# Patient Record
Sex: Female | Born: 1980 | Race: Black or African American | Hispanic: No | Smoking: Never smoker
Health system: Southern US, Community
[De-identification: ages and names within clinical notes are randomized; demographics above are authoritative.]

## PROBLEM LIST (undated history)

## (undated) DIAGNOSIS — T7840XA Allergy, unspecified, initial encounter: Secondary | ICD-10-CM

## (undated) DIAGNOSIS — D649 Anemia, unspecified: Secondary | ICD-10-CM

## (undated) DIAGNOSIS — E119 Type 2 diabetes mellitus without complications: Secondary | ICD-10-CM

## (undated) HISTORY — DX: Allergy, unspecified, initial encounter: T78.40XA

---
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
## 1998-10-21 ENCOUNTER — Emergency Department (HOSPITAL_COMMUNITY): Admission: EM | Admit: 1998-10-21 | Discharge: 1998-10-21 | Payer: Self-pay | Admitting: Emergency Medicine

## 1999-01-31 ENCOUNTER — Other Ambulatory Visit: Admission: RE | Admit: 1999-01-31 | Discharge: 1999-01-31 | Payer: Self-pay | Admitting: Obstetrics and Gynecology

## 2000-05-27 ENCOUNTER — Emergency Department (HOSPITAL_COMMUNITY): Admission: EM | Admit: 2000-05-27 | Discharge: 2000-05-27 | Payer: Self-pay | Admitting: Emergency Medicine

## 2001-05-19 ENCOUNTER — Other Ambulatory Visit: Admission: RE | Admit: 2001-05-19 | Discharge: 2001-05-19 | Payer: Self-pay | Admitting: Obstetrics and Gynecology

## 2002-02-21 ENCOUNTER — Emergency Department (HOSPITAL_COMMUNITY): Admission: EM | Admit: 2002-02-21 | Discharge: 2002-02-21 | Payer: Self-pay | Admitting: Emergency Medicine

## 2002-10-16 ENCOUNTER — Other Ambulatory Visit: Admission: RE | Admit: 2002-10-16 | Discharge: 2002-10-16 | Payer: Self-pay | Admitting: Obstetrics and Gynecology

## 2002-10-19 ENCOUNTER — Other Ambulatory Visit: Admission: RE | Admit: 2002-10-19 | Discharge: 2002-10-19 | Payer: Self-pay | Admitting: Obstetrics and Gynecology

## 2002-10-22 ENCOUNTER — Ambulatory Visit (HOSPITAL_COMMUNITY): Admission: RE | Admit: 2002-10-22 | Discharge: 2002-10-22 | Payer: Self-pay | Admitting: Obstetrics and Gynecology

## 2002-10-22 ENCOUNTER — Encounter (INDEPENDENT_AMBULATORY_CARE_PROVIDER_SITE_OTHER): Payer: Self-pay

## 2003-04-20 ENCOUNTER — Other Ambulatory Visit: Admission: RE | Admit: 2003-04-20 | Discharge: 2003-04-20 | Payer: Self-pay | Admitting: Obstetrics and Gynecology

## 2003-10-05 ENCOUNTER — Inpatient Hospital Stay (HOSPITAL_COMMUNITY): Admission: AD | Admit: 2003-10-05 | Discharge: 2003-10-05 | Payer: Self-pay | Admitting: Obstetrics and Gynecology

## 2003-11-04 ENCOUNTER — Inpatient Hospital Stay (HOSPITAL_COMMUNITY): Admission: AD | Admit: 2003-11-04 | Discharge: 2003-11-04 | Payer: Self-pay | Admitting: Obstetrics and Gynecology

## 2003-11-08 ENCOUNTER — Inpatient Hospital Stay (HOSPITAL_COMMUNITY): Admission: AD | Admit: 2003-11-08 | Discharge: 2003-11-12 | Payer: Self-pay | Admitting: Obstetrics and Gynecology

## 2003-12-15 ENCOUNTER — Other Ambulatory Visit: Admission: RE | Admit: 2003-12-15 | Discharge: 2003-12-15 | Payer: Self-pay | Admitting: Obstetrics and Gynecology

## 2004-04-04 ENCOUNTER — Other Ambulatory Visit: Admission: RE | Admit: 2004-04-04 | Discharge: 2004-04-04 | Payer: Self-pay | Admitting: Obstetrics and Gynecology

## 2004-08-10 ENCOUNTER — Emergency Department (HOSPITAL_COMMUNITY): Admission: EM | Admit: 2004-08-10 | Discharge: 2004-08-10 | Payer: Self-pay | Admitting: Emergency Medicine

## 2006-04-22 ENCOUNTER — Emergency Department (HOSPITAL_COMMUNITY): Admission: EM | Admit: 2006-04-22 | Discharge: 2006-04-23 | Payer: Self-pay | Admitting: Emergency Medicine

## 2006-12-12 ENCOUNTER — Emergency Department (HOSPITAL_COMMUNITY): Admission: EM | Admit: 2006-12-12 | Discharge: 2006-12-12 | Payer: Self-pay | Admitting: Emergency Medicine

## 2007-02-11 ENCOUNTER — Emergency Department (HOSPITAL_COMMUNITY): Admission: EM | Admit: 2007-02-11 | Discharge: 2007-02-11 | Payer: Self-pay | Admitting: Emergency Medicine

## 2007-06-22 ENCOUNTER — Emergency Department (HOSPITAL_COMMUNITY): Admission: EM | Admit: 2007-06-22 | Discharge: 2007-06-22 | Payer: Self-pay | Admitting: Emergency Medicine

## 2007-08-21 ENCOUNTER — Emergency Department (HOSPITAL_COMMUNITY): Admission: EM | Admit: 2007-08-21 | Discharge: 2007-08-21 | Payer: Self-pay | Admitting: Emergency Medicine

## 2010-10-20 NOTE — Op Note (Signed)
Patricia Burton, Patricia Burton                         ACCOUNT NO.:  [id number]   MEDICAL RECORD NO.:  [id number]                   PATIENT TYPE:  INP   LOCATION:  9143                                 FACILITY:  [facility]   PHYSICIAN:  Freddy Finner, M.D.                DATE OF BIRTH:  [birth year]   DATE OF PROCEDURE:  11/09/2003  DATE OF DISCHARGE:                                 OPERATIVE REPORT   PREOPERATIVE DIAGNOSIS:  1. Intrauterine pregnancy at term.  2. Nonreassuring fetal heart tracing.  3. Protracted active phase of labor with molding of the fetal vertex at a -1     to 0 station.   POSTOPERATIVE DIAGNOSIS:  1. Intrauterine pregnancy at term.  2. Nonreassuring fetal heart tracing.  3. Protracted active phase of labor with molding of the fetal vertex at a -1     to 0 station.   OPERATIVE PROCEDURE:  Primary low transverse cervical cesarean section with  delivery of viable female infant, Apgars of 8 and 9, cord pH of 7.20.   Patient is a 30 year old admitted in early labor.  Early in her labor course  she had placement of an epidural and augmentation of her labor with IV  Pitocin when failure of progression occurred with simple rupture of  membranes.  From the outset the fetal vertex was very high although it was  well applied to the cervix, it was in a -3 station.  Later in the course of  her labor an intrauterine pressure transducer was applied and the patient  was then allowed to labor with adequate __________ units for a period of  approximately 6 hours.  She progressed to complete and pushing but was  having moderate variable decelerations with each contraction and rebound  fetal tachycardia.  Examination of the patient did reveal that the vertex  was molding, that the true biparietal diameter had not descended into the  pelvis.  It was elected to proceed with cesarean delivery.  She was brought  to the operating room where the existing epidural was dosed for surgery.  She had a  Foley catheter in place.  The abdomen was prepped and draped in  the usual fashion.  A lower abdominal transverse incision was made sharply  and extended sharply to the fascia.  Fascia was entered sharply and extended  transversely to the extent of skin incision.  Rectus sheath was developed  superiorly and inferiorly with blunt and sharp dissection.  Rectus muscles  were divided in the midline.  Peritoneum was elevated, entered sharply and  extended bluntly to the skin incision.  The lower segment was fully thinned  and a transverse incision was made to the lower segment without dissecting  the bladder free.  This was performed above the reflection of the bladder.  Amniotic fluid was clear.  Incision in the uterus was extended bluntly in a  transverse direction.  A  viable female infant was then delivered without  difficulty.  Apgars and cord pH are noted above.  Cord blood was obtained  for routine venous sample and for arterial blood gases.  Placenta and other  products of conception were removed from the uterus.  The uterus was  delivered onto the anterior abdominal wall.  Angles of the uterine incision  were grasped with ring forceps.  Uterus, tubes and ovaries were noted to be  normal.  The uterus was initially boggy but responded to IV Pitocin.  The  uterine incision was closed with a running locking 0 Monocryl suture.  Figure-of-eight was required at the left margin for complete hemostasis.  The bladder flap was reapproximated with an interrupted 0 Monocryl suture.  The uterus was delivered back into the abdominal cavity.  Irrigation was  carried out.  Pack, needle and instrument counts were correct.  Hemostasis  was adequate.  The abdominal incision was then closed in layers.  Running 0  Monocryl was used to close the peritoneum and  reapproximate the rectus muscles.  The fascia was closed with running 0 PDS,  skin was closed with a lot of skin staples and 0.25 Steri-Strips.   Bleeding  subcutaneous vessels were controlled with the Bovie.  The patient tolerated  the operative procedure well, was taken to recovery in good condition.                                               Freddy Finner, M.D.    WRN/MEDQ  D:  11/09/2003  T:  11/09/2003  Job:  [id number]

## 2010-10-20 NOTE — Discharge Summary (Signed)
Patricia Burton, Patricia Burton                         ACCOUNT NO.:  [id number]   MEDICAL RECORD NO.:  [id number]                   PATIENT TYPE:  INP   LOCATION:  9143                                 [facility]ILITY[facility]  [facility]   PHYSICIAN:  Freddy Finner, M.D.                DATE OF BIRTH:  1980-11-05   DATE OF ADMISSION:  11/08/2003  DATE OF DISCHARGE:  11/12/2003                                 DISCHARGE SUMMARY   ADMISSION DIAGNOSES:  1. Intrauterine pregnancy at term.  2. Early labor.   DISCHARGE DIAGNOSES:  1. Status post low transverse cesarean section secondary to nonreassuring     fetal heart tones and cephalopelvic disproportion.  2. Viable female infant.   PROCEDURE:  Primary low transverse cesarean section.   REASON FOR ADMISSION:  Please see written H&P.   HOSPITAL COURSE:  The patient was a 30 year old African American female,  gravida 3, para 1 who was admitted to [facility] in early  labor.  Early in her labor course, the patient did have placement of an  epidural for her comfort and augmentation of her labor with IV Pitocin.  Artificial rupture of membranes was performed revealing clear fluid.  Vertex  remained high throughout her labor in -3 station.  Intrauterine pressure  catheter was applied for assessment of adequate labor contractions.  Patient  did progress to complete dilatation and did push with fetal heart tones  revealing moderate variable decelerations with each contraction and rebound  fetal tachycardia.  Vaginal examination did reveal that the vertex was  molding with a true biparietal diameter not descended into the pelvis.  At  that time, decision was made to proceed with low transverse cesarean  section.  The patient was brought to the operating room where epidural was  dosed to an adequate surgical level.  A low transverse incision was made  with delivery of a viable female infant weighing 9 pounds 5 ounces, Apgars  of 8 at one minute and 9 at  five minutes.  Umbilical cord pH was 7.20.  The  patient tolerated the procedure well and was taken to the recovery room in  stable condition.   On postoperative day #1, vital signs were stable.  She was afebrile.  Abdomen was soft with good return of bowel function.  Fundus was firm and  nontender.  Abdominal dressing was noted to be clean, dry and intact.  Labs  revealed hemoglobin of 8.9, platelet count of 242,000, wbc count of 12.4.   On postoperative day #2, the patient was without complaints.  Vital signs  were stable.  She was afebrile.  Fundus was firm and nontender.  Incision  was clean, dry and intact.  She was ambulating well and tolerating a regular  diet without complaints of nausea or vomiting.   On postoperative day #3, the patient was without complaints.  Vital signs  were stable.  She was  afebrile.  Fundus was firm and nontender.  Incision  was clean, dry and intact.  Staples removed and the patient was discharged  home.   CONDITION ON DISCHARGE:  Good.   DIET:  Regular as tolerated.   ACTIVITY:  No heavy lifting, no driving x2 weeks, no vaginal entry.   FOLLOW UP:  The patient is to follow up in the office in one week for an  incision check.  She is to call for temperature greater than 100 degrees,  persistent nausea or vomiting, heavy vaginal bleeding, and/or redness from  the incision site.   DISCHARGE MEDICATIONS:  1. Percocet 5/325 #30 one p.o. q.4-6h. p.r.n.  2. Motrin 600 mg every six hours.  3. Prenatal vitamins one p.o. daily.  4. Colace one p.o. daily p.r.n.     Julio Sicks, N.P.                        Freddy Finner, M.D.    CC/MEDQ  D:  11/17/2003  T:  11/17/2003  Job:  [id number]

## 2010-10-20 NOTE — Op Note (Signed)
NAMESARETTA, DAHLEM                         ACCOUNT NO.:  [id number]   MEDICAL RECORD NO.:  [id number]                   PATIENT TYPE:  AMB   LOCATION:  SDC                                  [facility]:  [facility]   PHYSICIAN:  Dineen Kid. Rana Snare, M.D.                 DATE OF BIRTH:  [birth year]   DATE OF PROCEDURE:  10/22/2002  DATE OF DISCHARGE:                                 OPERATIVE REPORT   PREOPERATIVE DIAGNOSIS:  Missed abortion, 10 weeks' estimated gestational  age.   POSTOPERATIVE DIAGNOSIS:  Missed abortion, 10 weeks' estimated gestational  age.   PROCEDURE:  Dilation and evacuation.   SURGEON:  Dineen Kid. Rana Snare, M.D.   ANESTHESIA:  Monitored anesthetic care and paracervical block.   INDICATIONS:  The patient is a 30 year old G2, P0, at 10 weeks, who  presented for routine OB visit.  Unable to hear the fetal heart tones.  Ultrasound was performed showing an intrauterine fetal demise.  She presents  for dilation and evacuation.  She is A positive.  The risks and benefits  were discussed and informed consent was obtained.   DESCRIPTION OF PROCEDURE:  After adequate analgesia, the patient was placed  in the dorsal lithotomy position.  She was sterilely prepped and draped, the  bladder was sterilely drained, and a weighted speculum was placed.  A  paracervical block was placed with 1% Xylocaine with 1:100,000 epinephrine  20 mL used.  A tenaculum was placed on the anterior lip of the cervix.  The  uterus was sounded to 10 cm.  It was easily dilated up to a #31 Pratt  dilator.  The 8 mm suction curette was inserted and products of conception  were retrieved.  This was performed until the endometrial cavity was emptied  as evidenced by a gritty surface throughout the endometrial cavity and no  remaining products being retrieved through the suction curette.  At this  point Methergine 0.2 mg was given to the patient IM with good uterine  response.  The curette was removed.  The  tenaculum was removed from the  anterior lip of the cervix and noted to be hemostatic.  The speculum was  then removed.  The patient was transferred to the recovery room in stable  condition.  Sponge and instrument count was normal x3.  Estimated blood loss  less than 20 mL.   DISPOSITION:  The patient will be discharged home and will follow up in the  office in two to three weeks.  She was given a prescription for Methergine  0.2 mg to take every eight hours for two days and doxycycline 100 mg p.o.  b.i.d. x7 days, also given a prescription for Mycolog to apply externally  b.i.d. as needed, 30 g, for vulvitis symptoms she had preoperatively.  Dineen Kid Rana Snare, M.D.    DCL/MEDQ  D:  10/22/2002  T:  10/22/2002  Job:  [id number]

## 2010-10-20 NOTE — H&P (Signed)
   Patricia Burton, Patricia Burton                         ACCOUNT NO.:  [id number]   MEDICAL RECORD NO.:  [id number]                   PATIENT TYPE:  AMB   LOCATION:  SDC                                  [facility]   PHYSICIAN:  Dineen Kid. Rana Snare, M.D.                 DATE OF BIRTH:  1981-03-31   DATE OF ADMISSION:  10/22/2002  DATE OF DISCHARGE:                                HISTORY & PHYSICAL   HISTORY OF PRESENT ILLNESS:  The patient is a 30 year old G2, P0, A1 at  approximately 10 weeks estimated gestational age who presented for an  ultrasound due to not being able to hear fetal heartbeats on her first  visit.  Confirms fetal demise.  Sac 8 weeks size and the fetus 6 weeks with  no fetal heart tones.  She presents today for dilatation and evacuation.  Her blood type is A+.   PAST MEDICAL HISTORY:  Negative.   PAST SURGICAL HISTORY:  Significant for D&E and a history of an ovarian cyst  removal.   MEDICATIONS:  Prenatal vitamins.   ALLERGIES:  She has an allergy to SULFA.   PHYSICAL EXAMINATION:  VITAL SIGNS:  Blood pressure 118/74.  HEART:  Regular rate and rhythm.  LUNGS:  Clear to auscultation bilaterally.  ABDOMEN:  Nondistended, nontender.  PELVIC:  Uterus is anteverted, mobile, nontender, approximately 8 weeks  size.  Cervix was closed, thick, and high.   IMPRESSION AND PLAN:  Intrauterine pregnancy at 10 weeks estimated  gestational age, 8 week sized sac, embryonic fetal demise.  The patient was  offered expectant management versus D&E.  She requests D&E.  Risks and  benefits discussed at length which include, but not limited to, risk of  infection, bleeding, damage to uterus, tubes, ovaries, bowel, or bladder,  risks associated with anesthesia and also associated with blood transfusion.  She does give her informed consent.                                               Dineen Kid Rana Snare, M.D.    DCL/MEDQ  D:  10/22/2002  T:  10/22/2002  Job:  [id number]

## 2011-01-18 ENCOUNTER — Emergency Department (HOSPITAL_COMMUNITY): Payer: Medicaid Other

## 2011-01-18 ENCOUNTER — Emergency Department (HOSPITAL_COMMUNITY)
Admission: EM | Admit: 2011-01-18 | Discharge: 2011-01-18 | Disposition: A | Discharge status: Home / Self Care | Payer: Medicaid Other | Attending: Emergency Medicine | Admitting: Emergency Medicine

## 2011-01-18 DIAGNOSIS — M545 Low back pain, unspecified: Secondary | ICD-10-CM | POA: Insufficient documentation

## 2011-01-18 DIAGNOSIS — X58XXXA Exposure to other specified factors, initial encounter: Secondary | ICD-10-CM | POA: Insufficient documentation

## 2011-01-18 DIAGNOSIS — S335XXA Sprain of ligaments of lumbar spine, initial encounter: Secondary | ICD-10-CM | POA: Insufficient documentation

## 2011-02-26 LAB — URINALYSIS, ROUTINE W REFLEX MICROSCOPIC
Glucose, UA: NEGATIVE
Nitrite: NEGATIVE
pH: 6

## 2011-02-26 LAB — COMPREHENSIVE METABOLIC PANEL
AST: 26
CO2: 25
Calcium: 8.6
Chloride: 100
Glucose, Bld: 177 — ABNORMAL HIGH
Total Bilirubin: 0.9
Total Protein: 6.8

## 2011-02-26 LAB — CBC
HCT: 28.3 — ABNORMAL LOW
Hemoglobin: 8.6 — ABNORMAL LOW
RBC: 4.95
WBC: 18.1 — ABNORMAL HIGH

## 2011-02-26 LAB — POCT PREGNANCY, URINE: Preg Test, Ur: NEGATIVE

## 2011-02-26 LAB — DIFFERENTIAL
Basophils Absolute: 0
Eosinophils Relative: 0
Monocytes Absolute: 1.8 — ABNORMAL HIGH
Neutro Abs: 15.2 — ABNORMAL HIGH
Neutrophils Relative %: 84 — ABNORMAL HIGH

## 2011-02-26 LAB — URINE MICROSCOPIC-ADD ON

## 2011-02-26 LAB — URINE CULTURE

## 2012-01-02 ENCOUNTER — Encounter (HOSPITAL_COMMUNITY): Payer: Self-pay | Admitting: *Deleted

## 2012-01-02 ENCOUNTER — Emergency Department (HOSPITAL_COMMUNITY)
Admission: EM | Admit: 2012-01-02 | Discharge: 2012-01-02 | Disposition: A | Discharge status: Home / Self Care | Payer: Medicaid Other | Attending: Emergency Medicine | Admitting: Emergency Medicine

## 2012-01-02 DIAGNOSIS — T148XXA Other injury of unspecified body region, initial encounter: Secondary | ICD-10-CM

## 2012-01-02 DIAGNOSIS — M549 Dorsalgia, unspecified: Secondary | ICD-10-CM | POA: Insufficient documentation

## 2012-01-02 MED ORDER — IBUPROFEN 400 MG PO TABS
800.0000 mg | ORAL_TABLET | Freq: Once | ORAL | Status: AC
  Administered 2012-01-02: 800 mg via ORAL
  Filled 2012-01-02 (×2): qty 2

## 2012-01-02 MED ORDER — METHOCARBAMOL 750 MG PO TABS: 750.0000 mg | ORAL_TABLET | Freq: Four times a day (QID) | ORAL | Status: AC

## 2012-01-02 MED ORDER — DIAZEPAM 5 MG PO TABS
10.0000 mg | ORAL_TABLET | Freq: Once | ORAL | Status: AC
  Administered 2012-01-02: 10 mg via ORAL

## 2012-01-02 MED ORDER — IBUPROFEN 600 MG PO TABS: 600.0000 mg | ORAL_TABLET | Freq: Four times a day (QID) | ORAL | Status: AC | PRN

## 2012-01-02 MED ORDER — HYDROCODONE-ACETAMINOPHEN 5-325 MG PO TABS: 2.0000 | ORAL_TABLET | ORAL | Status: AC | PRN

## 2012-01-02 MED ORDER — OXYCODONE-ACETAMINOPHEN 5-325 MG PO TABS
1.0000 | ORAL_TABLET | Freq: Once | ORAL | Status: AC
  Administered 2012-01-02: 1 via ORAL
  Filled 2012-01-02: qty 1

## 2012-01-02 NOTE — ED Notes (Signed)
Back spasms - lower, rt. Side. Difficult to sit and walk at times. Taking tylenol.

## 2012-01-02 NOTE — ED Provider Notes (Signed)
History   This chart was scribed for Toy Baker, MD, MD by Smitty Pluck. The patient was seen in room TR07C and the patient's care was started at 1:40PM.  CSN: [id number]  Arrival date & time 01/02/12  1251   None     Chief Complaint  Patient presents with  . Back Pain    (Consider location/radiation/quality/duration/timing/severity/associated sxs/prior treatment) Patient is a 31 y.o. female presenting with back pain. The history is provided by the patient.  Back Pain  Pertinent negatives include no fever and no weakness.   Patricia Burton is a 31 y.o. female who presents to the Emergency Department complaining of moderate right lower back spasms onset 1 day ago. Pt reports that the muscle contracts. Denies radiation. Reports that pain is aggravated by walking and sitting. Denies dysuria, blood in urine and hx of back surgery. Pt has taken extra strength tylenol without relief. Denies any other pain. 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    No past medical history on file.  No past surgical history on file.  No family history on file.  History  Substance Use Topics  . Smoking status: Not on file  . Smokeless tobacco: Not on file  . Alcohol Use: Not on file    OB History    No data available      Review of Systems  Constitutional: Negative for fever and chills.  Respiratory: Negative for shortness of breath.   Gastrointestinal: Negative for nausea and vomiting.  Musculoskeletal: Positive for back pain.  Neurological: Negative for weakness.  All other systems reviewed and are negative.    Allergies  Pollen extract and Sulfa antibiotics  Home Medications  No current outpatient prescriptions on file.  BP 136/83  Pulse 96  Temp 98.3 F (36.8 C) (Oral)  Resp 19  Ht 5\' 8"  (1.727 m)  Wt 200 lb (90.719 kg)  BMI 30.41 kg/m2  SpO2 99%  LMP 12/31/2011  Physical Exam  Nursing note and vitals reviewed. Constitutional: She is oriented to person, place, and  time. She appears well-developed and well-nourished. No distress.  HENT:  Head: Normocephalic and atraumatic.  Eyes: Conjunctivae are normal.  Neck: Normal range of motion. Neck supple.  Pulmonary/Chest: Effort normal. No respiratory distress.  Musculoskeletal:       Tenderness to palpation right lower lumbar paraspinal  no CVA tenderness  Nl lower extremities   Neurological: She is alert and oriented to person, place, and time.       Patellar reflexes trace bilaterally  Right leg flexion pain No foot drop   Skin: Skin is warm and dry.  Psychiatric: She has a normal mood and affect. Her behavior is normal.    ED Course  Procedures (including critical care time) DIAGNOSTIC STUDIES: Oxygen Saturation is 99% on room air, normal by my interpretation.    COORDINATION OF CARE: 1:45PM EDP discusses pt ED treatment with pt  1:45PM EDP ordered medication: Scheduled Meds:    . diazepam  10 mg Oral Once  . ibuprofen  800 mg Oral Once  . oxyCODONE-acetaminophen  1 tablet Oral Once   Continuous Infusions:  PRN Meds:.    Labs Reviewed - No data to display No results found.   No diagnosis found.    MDM  Pt given meds for musculoskeletal back pain   I personally performed the services described in this documentation, which was scribed in my presence. The recorded information has been reviewed and considered.  Toy Baker, MD 01/02/12 [phone]

## 2012-01-02 NOTE — Discharge Instructions (Signed)
Muscle Strain A muscle strain (pulled muscle) happens when a muscle is over-stretched. Recovery usually takes 5 to 6 weeks.  HOME CARE   Put ice on the injured area.   Put ice in a plastic bag.   Place a towel between your skin and the bag.   Leave the ice on for 15 to 20 minutes at a time, every hour for the first 2 days.   Do not use the muscle for several days or until your doctor says you can. Do not use the muscle if you have pain.   Wrap the injured area with an elastic bandage for comfort. Do not put it on too tightly.   Only take medicine as told by your doctor.   Warm up before exercise. This helps prevent muscle strains.  GET HELP RIGHT AWAY IF:  There is increased pain or puffiness (swelling) in the affected area. MAKE SURE YOU:   Understand these instructions.   Will watch your condition.   Will get help right away if you are not doing well or get worse.  Document Released: 02/28/2008 Document Revised: 05/10/2011 Document Reviewed: 02/28/2008 [facility] Patient Information 2012 [place], [place].Lumbosacral Strain Lumbosacral strain is one of the most common causes of back pain. There are many causes of back pain. Most are not serious conditions. CAUSES  Your backbone (spinal column) is made up of 24 main vertebral bodies, the sacrum, and the coccyx. These are held together by muscles and tough, fibrous tissue (ligaments). Nerve roots pass through the openings between the vertebrae. A sudden move or injury to the back may cause injury to, or pressure on, these nerves. This may result in localized back pain or pain movement (radiation) into the buttocks, down the leg, and into the foot. Sharp, shooting pain from the buttock down the back of the leg (sciatica) is frequently associated with a ruptured (herniated) disk. Pain may be caused by muscle spasm alone. Your caregiver can often find the cause of your pain by the details of your symptoms and an exam. In some cases, you may  need tests (such as X-rays). Your caregiver will work with you to decide if any tests are needed based on your specific exam. HOME CARE INSTRUCTIONS   Avoid an underactive lifestyle. Active exercise, as directed by your caregiver, is your greatest weapon against back pain.   Avoid hard physical activities (tennis, racquetball, waterskiing) if you are not in proper physical condition for it. This may aggravate or create problems.   If you have a back problem, avoid sports requiring sudden body movements. Swimming and walking are generally safer activities.   Maintain good posture.   Avoid becoming overweight (obese).   Use bed rest for only the most extreme, sudden (acute) episode. Your caregiver will help you determine how much bed rest is necessary.   For acute conditions, you may put ice on the injured area.   Put ice in a plastic bag.   Place a towel between your skin and the bag.   Leave the ice on for 15 to 20 minutes at a time, every 2 hours, or as needed.   After you are improved and more active, it may help to apply heat for 30 minutes before activities.  See your caregiver if you are having pain that lasts longer than expected. Your caregiver can advise appropriate exercises or therapy if needed. With conditioning, most back problems can be avoided. SEEK IMMEDIATE MEDICAL CARE IF:   You have numbness, tingling,  weakness, or problems with the use of your arms or legs.   You experience severe back pain not relieved with medicines.   There is a change in bowel or bladder control.   You have increasing pain in any area of the body, including your belly (abdomen).   You notice shortness of breath, dizziness, or feel faint.   You feel sick to your stomach (nauseous), are throwing up (vomiting), or become sweaty.   You notice discoloration of your toes or legs, or your feet get very cold.   Your back pain is getting worse.   You have a fever.  MAKE SURE YOU:    Understand these instructions.   Will watch your condition.   Will get help right away if you are not doing well or get worse.  Document Released: 02/28/2005 Document Revised: 05/10/2011 Document Reviewed: 08/20/2008 [facility] Patient Information 2012 [place], [place].

## 2012-07-21 ENCOUNTER — Emergency Department (HOSPITAL_COMMUNITY)
Admission: EM | Admit: 2012-07-21 | Discharge: 2012-07-21 | Disposition: A | Discharge status: Home / Self Care | Payer: Medicaid Other | Attending: Emergency Medicine | Admitting: Emergency Medicine

## 2012-07-21 ENCOUNTER — Encounter (HOSPITAL_COMMUNITY): Payer: Self-pay

## 2012-07-21 DIAGNOSIS — R5381 Other malaise: Secondary | ICD-10-CM | POA: Insufficient documentation

## 2012-07-21 DIAGNOSIS — J40 Bronchitis, not specified as acute or chronic: Secondary | ICD-10-CM

## 2012-07-21 DIAGNOSIS — J029 Acute pharyngitis, unspecified: Secondary | ICD-10-CM | POA: Insufficient documentation

## 2012-07-21 DIAGNOSIS — J209 Acute bronchitis, unspecified: Secondary | ICD-10-CM | POA: Insufficient documentation

## 2012-07-21 DIAGNOSIS — R1110 Vomiting, unspecified: Secondary | ICD-10-CM | POA: Insufficient documentation

## 2012-07-21 DIAGNOSIS — J3489 Other specified disorders of nose and nasal sinuses: Secondary | ICD-10-CM | POA: Insufficient documentation

## 2012-07-21 MED ORDER — GUAIFENESIN ER 600 MG PO TB12: 1200.0000 mg | ORAL_TABLET | Freq: Two times a day (BID) | ORAL | Status: DC

## 2012-07-21 MED ORDER — AZITHROMYCIN 250 MG PO TABS: 250.0000 mg | ORAL_TABLET | Freq: Every day | ORAL | Status: DC

## 2012-07-21 MED ORDER — CETIRIZINE-PSEUDOEPHEDRINE ER 5-120 MG PO TB12: 1.0000 | ORAL_TABLET | Freq: Every day | ORAL | Status: AC

## 2012-07-21 NOTE — ED Notes (Signed)
Pt reports productive cough w/yellow/green colored mucus and laryngitis x6 days

## 2012-07-21 NOTE — ED Provider Notes (Signed)
History     CSN: [id number]  Arrival date & time 07/21/12  0228   First MD Initiated Contact with Patient 07/21/12 0250      Chief Complaint  Patient presents with  . Cough   HPI  History provided by the patient. Patient is a 32 year old female with no significant PMH who presents with complaints of productive cough, rhinorrhea nasal congestion. Patient reports that symptoms began earlier last week. They have been persistent and worsening. She denies having any fever, chills or sweats at home. She has been using throat lozenges and cough drops to help with symptoms of reports very minimal relief. She is not use any other treatments. Sputum is described as a yellow-green mucus color. Denies any hemoptysis. 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 Denies any recent foreign travel. Symptoms are mild to moderate. Denies any other aggravating or alleviating factors. Denies any other associated symptoms.     History reviewed. No pertinent past medical history.  History reviewed. No pertinent past surgical history.  History reviewed. No pertinent family history.  History  Substance Use Topics  . Smoking status: Never Smoker   . Smokeless tobacco: Not on file  . Alcohol Use: No    OB History   Grav Para Term Preterm Abortions TAB SAB Ect Mult Living                  Review of Systems  Constitutional: Positive for fatigue. Negative for fever and chills.  HENT: Positive for congestion, sore throat and rhinorrhea.   Respiratory: Positive for cough.   Gastrointestinal: Positive for vomiting. Negative for nausea, abdominal pain and diarrhea.  Musculoskeletal: Negative for myalgias.  All other systems reviewed and are negative.    Allergies  Pollen extract and Sulfa antibiotics  Home Medications   Current Outpatient Rx  Name  Route  Sig  Dispense  Refill  . Throat Lozenges (MENTHOL COUGH DROPS MT)    Mouth/Throat   Use as directed 1 lozenge in the mouth or throat as needed (for sore throat).         Marland Kitchen azithromycin (ZITHROMAX) 250 MG tablet   Oral   Take 1 tablet (250 mg total) by mouth daily. Take first 2 tablets together, then 1 every day until finished.   6 tablet   0   . cetirizine-pseudoephedrine (ZYRTEC-D) 5-120 MG per tablet   Oral   Take 1 tablet by mouth daily.   30 tablet   0   . guaiFENesin (MUCINEX) 600 MG 12 hr tablet   Oral   Take 2 tablets (1,200 mg total) by mouth 2 (two) times daily.   30 tablet   0     BP 121/77  Pulse 103  Temp(Src) 98.2 F (36.8 C) (Oral)  Resp 18  SpO2 97%  LMP 07/15/2012  Physical Exam  Nursing note and vitals reviewed. Constitutional: She is oriented to person, place, and time. She appears well-developed and well-nourished. No distress.  HENT:  Head: Normocephalic.  Right Ear: Tympanic membrane normal.  Left Ear: Tympanic membrane normal.  Nose: Mucosal edema and rhinorrhea present.  Mouth/Throat: Oropharynx is clear and moist.  Eyes: Conjunctivae are normal.  Neck: Normal range of motion. Neck supple.  No meningeal sign  Cardiovascular: Normal rate and regular rhythm.   Pulmonary/Chest: Effort normal and breath sounds normal. No respiratory distress. She has no wheezes. She has no rales.  Abdominal: Soft. There  is no tenderness.  Musculoskeletal: Normal range of motion.  Lymphadenopathy:    She has no cervical adenopathy.  Neurological: She is alert and oriented to person, place, and time.  Skin: Skin is warm and dry. No rash noted.  Psychiatric: She has a normal mood and affect. Her behavior is normal.    ED Course  Procedures    1. Bronchitis       MDM  Patient seen and evaluated. Patient is well-appearing in no acute distress. She does not appear severely ill or toxic.        Angus Seller, [place] 07/21/12 [phone]

## 2012-07-21 NOTE — ED Provider Notes (Signed)
Medical screening examination/treatment/procedure(s) were performed by non-physician practitioner and as supervising physician I was immediately available for consultation/collaboration.  Maudry Zeidan M Prue Lingenfelter, MD 07/21/12 0643 

## 2015-02-28 ENCOUNTER — Ambulatory Visit: Payer: Self-pay

## 2015-11-02 ENCOUNTER — Ambulatory Visit: Payer: Medicaid Other | Admitting: Internal Medicine

## 2016-10-10 ENCOUNTER — Encounter (HOSPITAL_COMMUNITY): Payer: Self-pay

## 2016-10-10 ENCOUNTER — Emergency Department (HOSPITAL_COMMUNITY)
Admission: EM | Admit: 2016-10-10 | Discharge: 2016-10-10 | Disposition: A | Discharge status: Home / Self Care | Payer: Worker's Compensation | Attending: Emergency Medicine | Admitting: Emergency Medicine

## 2016-10-10 ENCOUNTER — Emergency Department (HOSPITAL_COMMUNITY): Payer: Worker's Compensation

## 2016-10-10 DIAGNOSIS — S199XXA Unspecified injury of neck, initial encounter: Secondary | ICD-10-CM | POA: Insufficient documentation

## 2016-10-10 DIAGNOSIS — R739 Hyperglycemia, unspecified: Secondary | ICD-10-CM

## 2016-10-10 DIAGNOSIS — S3991XA Unspecified injury of abdomen, initial encounter: Secondary | ICD-10-CM | POA: Insufficient documentation

## 2016-10-10 DIAGNOSIS — Y9389 Activity, other specified: Secondary | ICD-10-CM | POA: Diagnosis not present

## 2016-10-10 DIAGNOSIS — S3992XA Unspecified injury of lower back, initial encounter: Secondary | ICD-10-CM | POA: Insufficient documentation

## 2016-10-10 DIAGNOSIS — Y999 Unspecified external cause status: Secondary | ICD-10-CM | POA: Insufficient documentation

## 2016-10-10 DIAGNOSIS — M542 Cervicalgia: Secondary | ICD-10-CM

## 2016-10-10 DIAGNOSIS — S0990XA Unspecified injury of head, initial encounter: Secondary | ICD-10-CM | POA: Insufficient documentation

## 2016-10-10 DIAGNOSIS — S299XXA Unspecified injury of thorax, initial encounter: Secondary | ICD-10-CM | POA: Insufficient documentation

## 2016-10-10 DIAGNOSIS — Y92410 Unspecified street and highway as the place of occurrence of the external cause: Secondary | ICD-10-CM | POA: Insufficient documentation

## 2016-10-10 DIAGNOSIS — R4020 Unspecified coma: Secondary | ICD-10-CM

## 2016-10-10 DIAGNOSIS — M545 Low back pain, unspecified: Secondary | ICD-10-CM

## 2016-10-10 DIAGNOSIS — R1084 Generalized abdominal pain: Secondary | ICD-10-CM

## 2016-10-10 LAB — CBC WITH DIFFERENTIAL/PLATELET
BASOS PCT: 0 %
Basophils Absolute: 0 10*3/uL (ref 0.0–0.1)
EOS PCT: 0 %
Eosinophils Absolute: 0 10*3/uL (ref 0.0–0.7)
HEMATOCRIT: 40.8 % (ref 36.0–46.0)
Hemoglobin: 12.6 g/dL (ref 12.0–15.0)
LYMPHS ABS: 2 10*3/uL (ref 0.7–4.0)
Lymphocytes Relative: 27 %
MCH: 21.8 pg — AB (ref 26.0–34.0)
MCHC: 30.9 g/dL (ref 30.0–36.0)
MCV: 70.6 fL — AB (ref 78.0–100.0)
MONO ABS: 0.4 10*3/uL (ref 0.1–1.0)
MONOS PCT: 5 %
NEUTROS ABS: 5 10*3/uL (ref 1.7–7.7)
Neutrophils Relative %: 68 %
PLATELETS: 275 10*3/uL (ref 150–400)
RBC: 5.78 MIL/uL — ABNORMAL HIGH (ref 3.87–5.11)
RDW: 16.3 % — AB (ref 11.5–15.5)
WBC: 7.4 10*3/uL (ref 4.0–10.5)

## 2016-10-10 LAB — I-STAT CHEM 8, ED
BUN: 5 mg/dL — AB (ref 6–20)
CHLORIDE: 100 mmol/L — AB (ref 101–111)
Calcium, Ion: 1.16 mmol/L (ref 1.15–1.40)
Creatinine, Ser: 0.5 mg/dL (ref 0.44–1.00)
Glucose, Bld: 274 mg/dL — ABNORMAL HIGH (ref 65–99)
HEMATOCRIT: 41 % (ref 36.0–46.0)
Hemoglobin: 13.9 g/dL (ref 12.0–15.0)
POTASSIUM: 3.8 mmol/L (ref 3.5–5.1)
SODIUM: 137 mmol/L (ref 135–145)
TCO2: 26 mmol/L (ref 0–100)

## 2016-10-10 LAB — I-STAT BETA HCG BLOOD, ED (MC, WL, AP ONLY): I-stat hCG, quantitative: <5 m[IU]/mL (ref <5)

## 2016-10-10 MED ORDER — ACETAMINOPHEN 500 MG PO TABS
1000.0000 mg | ORAL_TABLET | Freq: Once | ORAL | Status: AC
  Administered 2016-10-10: 1000 mg via ORAL
  Filled 2016-10-10: qty 2

## 2016-10-10 MED ORDER — HYDROCODONE-ACETAMINOPHEN 5-325 MG PO TABS: 1.0000 | ORAL_TABLET | ORAL | 0 refills | Status: DC | PRN

## 2016-10-10 MED ORDER — IOPAMIDOL (ISOVUE-300) INJECTION 61%
INTRAVENOUS | Status: AC
  Administered 2016-10-10: 100 mL
  Filled 2016-10-10: qty 100

## 2016-10-10 MED ORDER — CYCLOBENZAPRINE HCL 5 MG PO TABS: 5.0000 mg | ORAL_TABLET | Freq: Two times a day (BID) | ORAL | 0 refills | Status: DC | PRN
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
## 2016-10-10 NOTE — Discharge Instructions (Signed)
You will be given a prescription for Flexeril (a muscle relaxant) and a pain medication that contains a narcotic and Tylenol.  Both of these medications may make you sleepy. If you take them please do not drive, operate heavy machinery, make important decisions, or be the sole provider for a young child for at least 18 hours after taking Flexeril or 12 hours after taking pain medication.  Your pain medication contains Tylenol and you received 1000 MG of Tylenol for pain here today. Please do not take more than 3000 MG of Tylenol in a 24-hour period.    Your blood sugar was high today. Please obtain a primary care provider and follow up with the for further evaluation as you may be diabetic.  If any of your symptoms worsen or if you have any concerns please seek additional medical care.

## 2016-10-10 NOTE — ED Notes (Signed)
Help get patient off the back board patient is resting

## 2016-10-15 ENCOUNTER — Encounter: Payer: Self-pay | Admitting: Physician Assistant

## 2016-10-15 ENCOUNTER — Ambulatory Visit (INDEPENDENT_AMBULATORY_CARE_PROVIDER_SITE_OTHER): Payer: BLUE CROSS/BLUE SHIELD | Admitting: Physician Assistant

## 2016-10-15 DIAGNOSIS — M542 Cervicalgia: Secondary | ICD-10-CM | POA: Diagnosis not present

## 2016-10-15 DIAGNOSIS — F0781 Postconcussional syndrome: Secondary | ICD-10-CM | POA: Diagnosis not present

## 2016-10-15 DIAGNOSIS — M546 Pain in thoracic spine: Secondary | ICD-10-CM | POA: Diagnosis not present

## 2016-10-15 MED ORDER — CYCLOBENZAPRINE HCL 5 MG PO TABS: 5.0000 mg | ORAL_TABLET | Freq: Two times a day (BID) | ORAL | 0 refills | Status: DC | PRN

## 2016-10-15 MED ORDER — NAPROXEN 500 MG PO TABS: 500.0000 mg | ORAL_TABLET | Freq: Two times a day (BID) | ORAL | 0 refills | Status: DC
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
## 2016-10-15 NOTE — Patient Instructions (Addendum)
I would like you to start taking naproxen twice a day for the next 1-2 weeks. Keep applying ice to the affected areas. Please start the stretches below. I would like you to stretch daily. Do not cause pain but it is okay to get to a point of discomfort with these stretches. Avoid chiropractor at this time as you need to fully heal before seeing them again. Your pain may take up to a few weeks to fully stop but you should be getting better not worse, if your symptoms worsen, please come back here immediately or the ED for further evaluation.   For post concussion syndrome, as long as you are not having any of the side effects we discussed in office (I.e. headache, nausea, vomiting, blurred vision, difficulty focusing, confusion, and slurred speech) you can continue brain activities. However if you start to have these, please stop brain activities and return for evaluation. Below is some info on concussions.  Cervical Strain and Sprain Rehab Ask your health care provider which exercises are safe for you. Do exercises exactly as told by your health care provider and adjust them as directed. It is normal to feel mild stretching, pulling, tightness, or discomfort as you do these exercises, but you should stop right away if you feel sudden pain or your pain gets worse.Do not begin these exercises until told by your health care provider. Stretching and range of motion exercises These exercises warm up your muscles and joints and improve the movement and flexibility of your neck. These exercises also help to relieve pain, numbness, and tingling. Exercise A: Cervical side bend   1. Using good posture, sit on a stable chair or stand up. 2. Without moving your shoulders, slowly tilt your left / right ear to your shoulder until you feel a stretch in your neck muscles. You should be looking straight ahead. 3. Hold for __________ seconds. 4. Repeat with the other side of your neck. Repeat __________ times. Complete  this exercise __________ times a day. Exercise B: Cervical rotation   1. Using good posture, sit on a stable chair or stand up. 2. Slowly turn your head to the side as if you are looking over your left / right shoulder.  Keep your eyes level with the ground.  Stop when you feel a stretch along the side and the back of your neck. 3. Hold for __________ seconds. 4. Repeat this by turning to your other side. Repeat __________ times. Complete this exercise __________ times a day. Exercise C: Thoracic extension and pectoral stretch  1. Roll a towel or a small blanket so it is about 4 inches (10 cm) in diameter. 2. Lie down on your back on a firm surface. 3. Put the towel lengthwise, under your spine in the middle of your back. It should not be not under your shoulder blades. The towel should line up with your spine from your middle back to your lower back. 4. Put your hands behind your head and let your elbows fall out to your sides. 5. Hold for __________ seconds. Repeat __________ times. Complete this exercise __________ times a day. Strengthening exercises These exercises build strength and endurance in your neck. Endurance is the ability to use your muscles for a long time, even after your muscles get tired. Exercise D: Upper cervical flexion, isometric  1. Lie on your back with a thin pillow behind your head and a small rolled-up towel under your neck. 2. Gently tuck your chin toward your chest  and nod your head down to look toward your feet. Do not lift your head off the pillow. 3. Hold for __________ seconds. 4. Release the tension slowly. Relax your neck muscles completely before you repeat this exercise. Repeat __________ times. Complete this exercise __________ times a day. Exercise E: Cervical extension, isometric   1. Stand about 6 inches (15 cm) away from a wall, with your back facing the wall. 2. Place a soft object, about 6-8 inches (15-20 cm) in diameter, between the back of  your head and the wall. A soft object could be a small pillow, a ball, or a folded towel. 3. Gently tilt your head back and press into the soft object. Keep your jaw and forehead relaxed. 4. Hold for __________ seconds. 5. Release the tension slowly. Relax your neck muscles completely before you repeat this exercise. Repeat __________ times. Complete this exercise __________ times a day. Posture and body mechanics   Body mechanics refers to the movements and positions of your body while you do your daily activities. Posture is part of body mechanics. Good posture and healthy body mechanics can help to relieve stress in your body's tissues and joints. Good posture means that your spine is in its natural S-curve position (your spine is neutral), your shoulders are pulled back slightly, and your head is not tipped forward. The following are general guidelines for applying improved posture and body mechanics to your everyday activities. Standing   When standing, keep your spine neutral and keep your feet about hip-width apart. Keep a slight bend in your knees. Your ears, shoulders, and hips should line up.  When you do a task in which you stand in one place for a long time, place one foot up on a stable object that is 2-4 inches (5-10 cm) high, such as a footstool. This helps keep your spine neutral. Sitting    When sitting, keep your spine neutral and your keep feet flat on the floor. Use a footrest, if necessary, and keep your thighs parallel to the floor. Avoid rounding your shoulders, and avoid tilting your head forward.  When working at a desk or a computer, keep your desk at a height where your hands are slightly lower than your elbows. Slide your chair under your desk so you are close enough to maintain good posture.  When working at a computer, place your monitor at a height where you are looking straight ahead and you do not have to tilt your head forward or downward to look at the  screen. Resting  When lying down and resting, avoid positions that are most painful for you. Try to support your neck in a neutral position. You can use a contour pillow or a small rolled-up towel. Your pillow should support your neck but not push on it. This information is not intended to replace advice given to you by your health care provider. Make sure you discuss any questions you have with your health care provider. Document Released: 05/21/2005 Document Revised: 01/26/2016 Document Reviewed: 04/27/2015 Elsevier Interactive Patient Education  2017 [employer]   Post-Concussion Syndrome Post-concussion syndrome is the symptoms that can occur after a head injury. These symptoms can last from weeks to months. Follow these instructions at home:  Take medicines only as told by your doctor.  Do not take aspirin.  Sleep with your head raised to help with headaches.  Avoid activities that can cause another head injury.  Do not play contact sports like football, hockey, soccer,  or basketball.  Do not do other risky activities like downhill skiing, martial arts, or horseback riding until your doctor says it is okay.  Keep all follow-up visits as told by your doctor. This is important. Contact a doctor if:  You have a harder time:  Paying attention.  Focusing.  Remembering.  Learning new information.  Dealing with stress.  You need more time to complete tasks.  You are easily bothered (irritable).  You have more symptoms. Get help if you have any of these symptoms for more than two weeks after your injury:  Long-lasting (chronic) headaches.  Dizziness.  Trouble balancing.  Feeling sick to your stomach (nauseous).  Trouble with your vision.  Noise or light bothers you more.  Depression.  Mood swings.  Feeling worried (anxious).  Easily bothered.  Memory problems.  Trouble concentrating or paying attention.  Sleep problems.  Feeling tired all of the  time. Get help right away if:  You feel confused.  You feel very sleepy.  You are hard to wake up.  You feel sick to your stomach.  You keep throwing up (vomiting).  You feel like you are moving when you are not (vertigo).  Your eyes move back and forth very quickly.  You start shaking (convulsing) or pass out (faint).  You have very bad headaches that do not get better with medicine.  You cannot use your arms or legs like normal.  One of the black centers of your eyes (pupils) is bigger than the other.  You have clear or bloody fluid coming from your nose or ears.  Your problems get worse, not better. This information is not intended to replace advice given to you by your health care provider. Make sure you discuss any questions you have with your health care provider. Document Released: 06/28/2004 Document Revised: 10/27/2015 Document Reviewed: 08/26/2013 Elsevier Interactive Patient Education  2017 [employer].   IF you received an x-ray today, you will receive an invoice from [facility]. Please contact [facility] at [phone] with questions or concerns regarding your invoice.   IF you received labwork today, you will receive an invoice from [place]. Please contact [facility] at [phone] with questions or concerns regarding your invoice.   Our billing staff will not be able to assist you with questions regarding bills from these companies.  You will be contacted with the lab results as soon as they are available. The fastest way to get your results is to activate your My Chart account. Instructions are located on the last page of this paperwork. If you have not heard from [place] regarding the results in 2 weeks, please contact this office.

## 2016-12-18 DIAGNOSIS — Z87820 Personal history of traumatic brain injury: Secondary | ICD-10-CM | POA: Insufficient documentation

## 2017-06-25 ENCOUNTER — Other Ambulatory Visit: Payer: Self-pay | Admitting: Obstetrics and Gynecology

## 2017-06-25 DIAGNOSIS — R928 Other abnormal and inconclusive findings on diagnostic imaging of breast: Secondary | ICD-10-CM

## 2017-07-01 DIAGNOSIS — G8929 Other chronic pain: Secondary | ICD-10-CM | POA: Insufficient documentation

## 2017-08-29 ENCOUNTER — Ambulatory Visit
Admission: RE | Admit: 2017-08-29 | Discharge: 2017-08-29 | Disposition: A | Discharge status: Home / Self Care | Payer: BLUE CROSS/BLUE SHIELD | Source: Ambulatory Visit | Attending: Obstetrics and Gynecology | Admitting: Obstetrics and Gynecology

## 2017-08-29 ENCOUNTER — Other Ambulatory Visit: Payer: Self-pay | Admitting: Obstetrics and Gynecology

## 2017-08-29 DIAGNOSIS — R928 Other abnormal and inconclusive findings on diagnostic imaging of breast: Secondary | ICD-10-CM

## 2017-08-30 ENCOUNTER — Ambulatory Visit
Admission: RE | Admit: 2017-08-30 | Discharge: 2017-08-30 | Disposition: A | Discharge status: Home / Self Care | Payer: BLUE CROSS/BLUE SHIELD | Source: Ambulatory Visit | Attending: Obstetrics and Gynecology | Admitting: Obstetrics and Gynecology

## 2017-08-30 ENCOUNTER — Other Ambulatory Visit: Payer: Self-pay | Admitting: Obstetrics and Gynecology

## 2017-08-30 DIAGNOSIS — R928 Other abnormal and inconclusive findings on diagnostic imaging of breast: Secondary | ICD-10-CM

## 2018-08-23 ENCOUNTER — Encounter (HOSPITAL_COMMUNITY): Payer: Self-pay

## 2018-08-23 ENCOUNTER — Other Ambulatory Visit: Payer: Self-pay

## 2018-08-23 ENCOUNTER — Emergency Department (HOSPITAL_COMMUNITY)
Admission: EM | Admit: 2018-08-23 | Discharge: 2018-08-23 | Disposition: A | Discharge status: Home / Self Care | Payer: BLUE CROSS/BLUE SHIELD | Attending: Emergency Medicine | Admitting: Emergency Medicine

## 2018-08-23 DIAGNOSIS — Z79899 Other long term (current) drug therapy: Secondary | ICD-10-CM | POA: Insufficient documentation

## 2018-08-23 DIAGNOSIS — Z711 Person with feared health complaint in whom no diagnosis is made: Secondary | ICD-10-CM | POA: Insufficient documentation

## 2018-08-23 NOTE — ED Triage Notes (Signed)
Pt states that she went with her dad to the dr's office. Pt states that she had her temp taken, was 99.4 there (after eating soup), and her work requires her to have a note to return.

## 2018-08-23 NOTE — ED Notes (Signed)
ED Provider at bedside. 

## 2018-08-23 NOTE — ED Provider Notes (Signed)
[facility]-EMERGENCY DEPT Provider Note   CSN: [id number] Arrival date & time: 08/23/18  2820    History   Chief Complaint Chief Complaint  Patient presents with  . Letter for School/Work    HPI Patricia Burton is a 38 y.o. female.     38 year old female presents requesting a note to be able to return to work.  Patient states that she took her father to a doctor's appointment and they checked her temperature which was 99.4.  Patient decided to err on the side of caution and reported her temperature reading to her supervisor who states she now must have a note to return to work.  Patient has not had a documented fever, has not felt unwell, denies cough, congestion, travel.  No other complaints.     History reviewed. No pertinent past medical history.  There are no active problems to display for this patient.   History reviewed. No pertinent surgical history.   OB History   No obstetric history on file.      Home Medications    Prior to Admission medications   Medication Sig Start Date End Date Taking? Authorizing Provider  cetirizine-pseudoephedrine (ZYRTEC-D) 5-120 MG per tablet Take 1 tablet by mouth daily. Patient not taking: Reported on 10/10/2016 07/21/12   Ivonne Andrew, PA-C  cyclobenzaprine (FLEXERIL) 5 MG tablet Take 1-2 tablets (5-10 mg total) by mouth 2 (two) times daily as needed for muscle spasms. 10/15/16   Magdalene River, PA-C  guaiFENesin (MUCINEX) 600 MG 12 hr tablet Take 2 tablets (1,200 mg total) by mouth 2 (two) times daily. Patient not taking: Reported on 10/10/2016 07/21/12   Ivonne Andrew, PA-C  HYDROcodone-acetaminophen (NORCO/VICODIN) 5-325 MG tablet Take 1-2 tablets by mouth every 4 (four) hours as needed for moderate pain or severe pain. 10/10/16   Cristina Gong, PA-C  naproxen (NAPROSYN) 500 MG tablet Take 1 tablet (500 mg total) by mouth 2 (two) times daily with a meal. 10/15/16   Magdalene River, PA-C    Family  History Family History  Problem Relation Age of Onset  . Breast cancer Neg Hx     Social History Social History   Tobacco Use  . Smoking status: Never Smoker  . Smokeless tobacco: Never Used  Substance Use Topics  . Alcohol use: No  . Drug use: No     Allergies   Other; Pollen extract; and Sulfa antibiotics   Review of Systems Review of Systems  Constitutional: Negative for chills and fever.  HENT: Negative for congestion and sore throat.   Respiratory: Negative for cough.   Allergic/Immunologic: Negative for immunocompromised state.  Hematological: Negative for adenopathy.     Physical Exam Updated Vital Signs BP 116/76 (BP Location: Left Arm)   Pulse (!) 103   Temp 98.1 F (36.7 C) (Oral)   Resp 16   Wt 110 kg   SpO2 99%   BMI 36.87 kg/m   Physical Exam Vitals signs and nursing note reviewed.  Constitutional:      General: She is not in acute distress.    Appearance: She is well-developed. She is not diaphoretic.  HENT:     Head: Normocephalic and atraumatic.  Cardiovascular:     Rate and Rhythm: Normal rate and regular rhythm.     Heart sounds: Normal heart sounds.  Pulmonary:     Effort: Pulmonary effort is normal.     Breath sounds: Normal breath sounds.  Skin:    General: Skin  is warm and dry.     Findings: No rash.  Neurological:     Mental Status: She is alert and oriented to person, place, and time.  Psychiatric:        Behavior: Behavior normal.      ED Treatments / Results  Labs (all labs ordered are listed, but only abnormal results are displayed) Labs Reviewed - No data to display  EKG None  Radiology No results found.  Procedures Procedures (including critical care time)  Medications Ordered in ED Medications - No data to display   Initial Impression / Assessment and Plan / ED Course  I have reviewed the triage vital signs and the nursing notes.  Pertinent labs & imaging results that were available during my care  of the patient were reviewed by me and considered in my medical decision making (see chart for details).  Clinical Course as of Aug 22 898  Sat Aug 23, 2018  [id number] 38 year old female presents for return to work note.  Patient informed her supervisor that her temperature was 99.4 at her father's doctor's appointment earlier and supervisor now requires note to return to work.  Patient's heart rate was elevated for triage vitals, has improved, she is well-appearing without any complaints today otherwise.  Offered reassurance, discussed fever as defined as temperature greater than 100.4, patient may return to work as she has not had a documented fever.   [LM]    Clinical Course User Index [LM] Jeannie Fend, PA-C   Final Clinical Impressions(s) / ED Diagnoses   Final diagnoses:  Worried well    ED Discharge Orders    None       Jeannie Fend, PA-C 08/23/18 0900    Benjiman Core, MD 08/23/18 1002

## 2018-08-23 NOTE — ED Notes (Signed)
D/c paperwork reviewed with paperwork.  Pt alert, calm. NAD.  Pt ambulatory at discharge.

## 2021-01-26 ENCOUNTER — Other Ambulatory Visit: Payer: Self-pay | Admitting: Obstetrics and Gynecology

## 2021-01-26 DIAGNOSIS — N6489 Other specified disorders of breast: Secondary | ICD-10-CM

## 2021-03-09 ENCOUNTER — Other Ambulatory Visit: Payer: Self-pay

## 2021-03-09 ENCOUNTER — Ambulatory Visit
Admission: RE | Admit: 2021-03-09 | Discharge: 2021-03-09 | Disposition: A | Discharge status: Home / Self Care | Payer: PRIVATE HEALTH INSURANCE | Source: Ambulatory Visit | Attending: Obstetrics and Gynecology | Admitting: Obstetrics and Gynecology

## 2021-03-09 DIAGNOSIS — N6489 Other specified disorders of breast: Secondary | ICD-10-CM

## 2021-12-01 IMAGING — MG DIGITAL DIAGNOSTIC BILAT W/ TOMO W/ CAD
8 series · 8 of 24 positions shown · non-contrast
Comparison: Previous exam(s).

CLINICAL DATA: 40-year-old female presenting for delayed follow-up
for a biopsied asymmetry in the right breast with results indicating
PASH. The biopsy was performed in August 2017.

EXAM:
DIGITAL DIAGNOSTIC BILATERAL MAMMOGRAM WITH TOMOSYNTHESIS AND CAD
TECHNIQUE: Bilateral digital diagnostic mammography and breast tomosynthesis
was performed. The images were evaluated with computer-aided
detection.

[R MLO synth-2D]
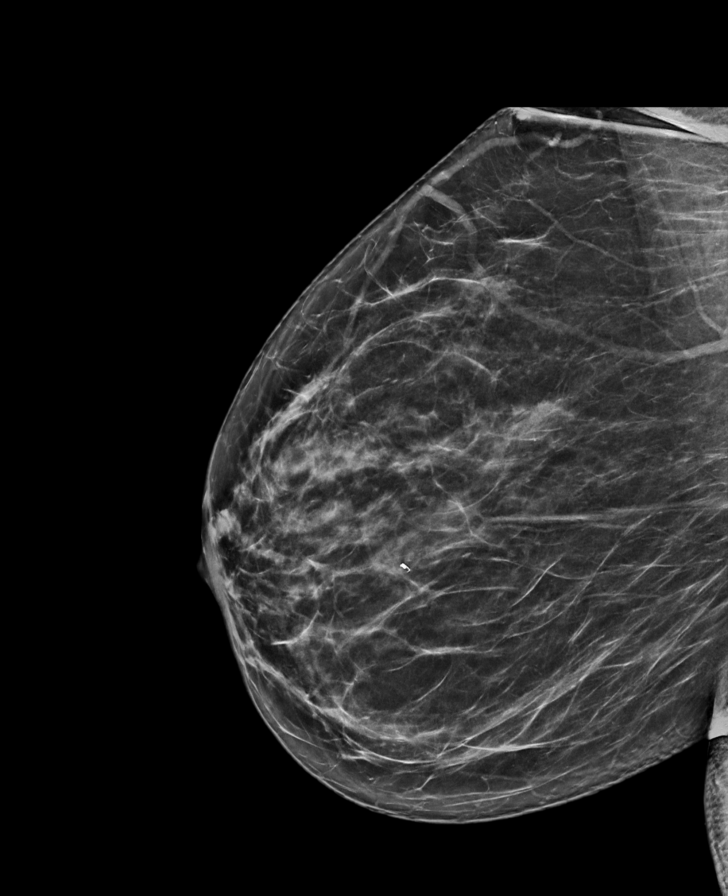

[L CC synth-2D]
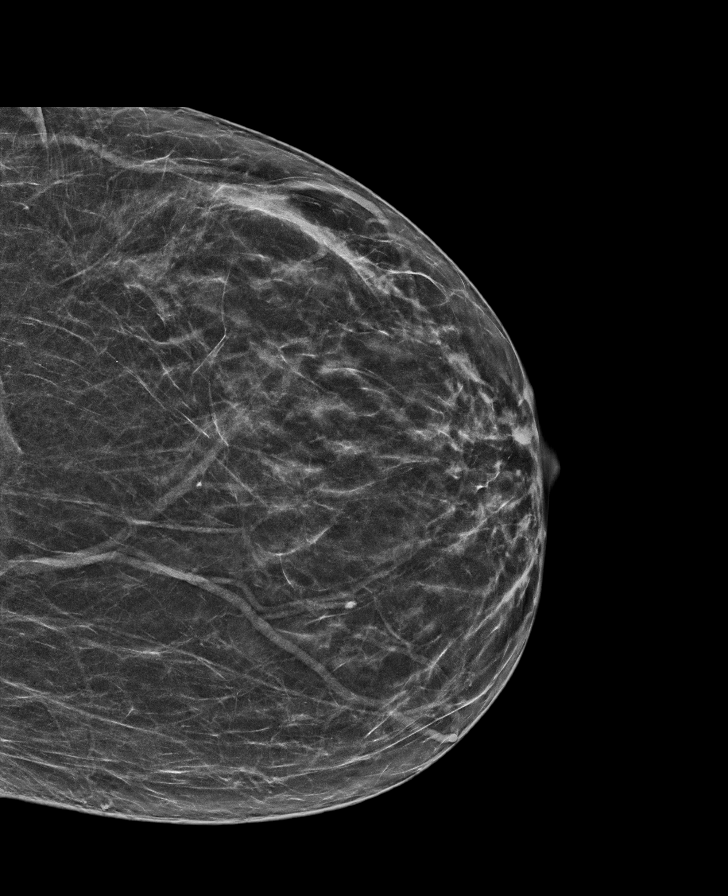

[R CC synth-2D]
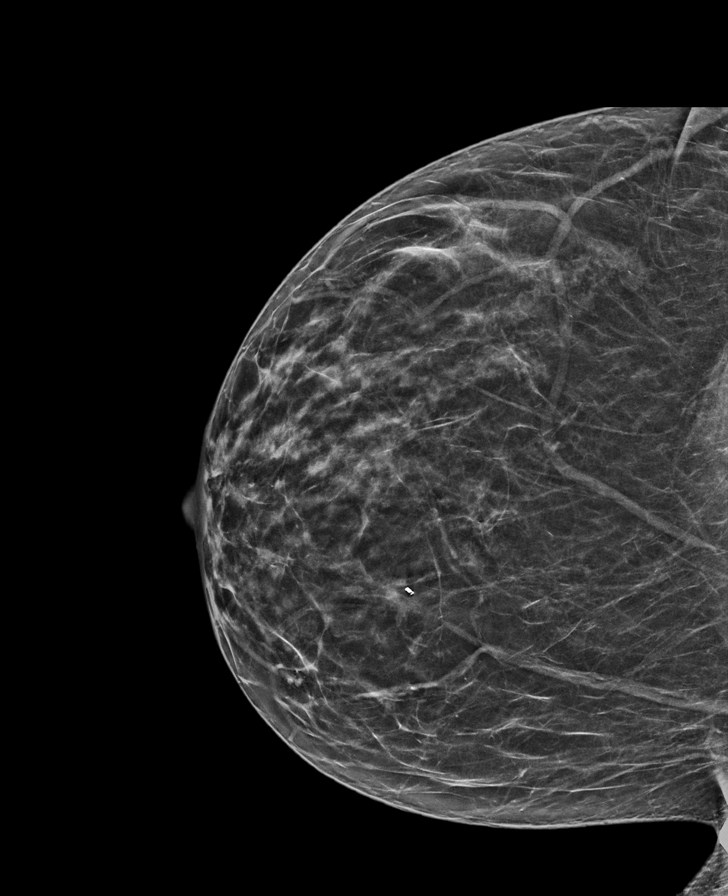

[L MLO synth-2D]
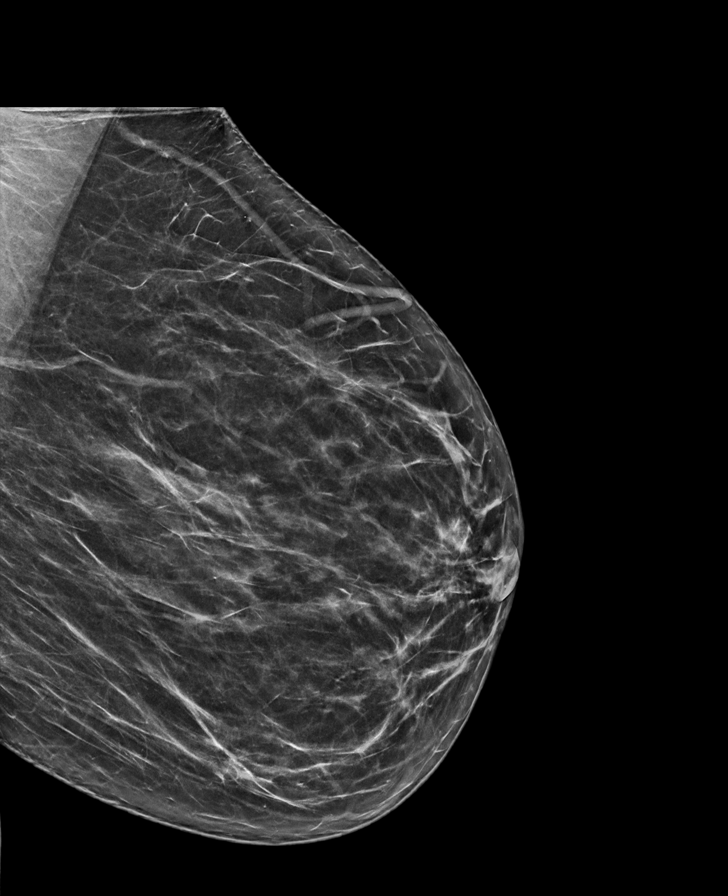

[L CC tomo · tomo slice 33/66.0]
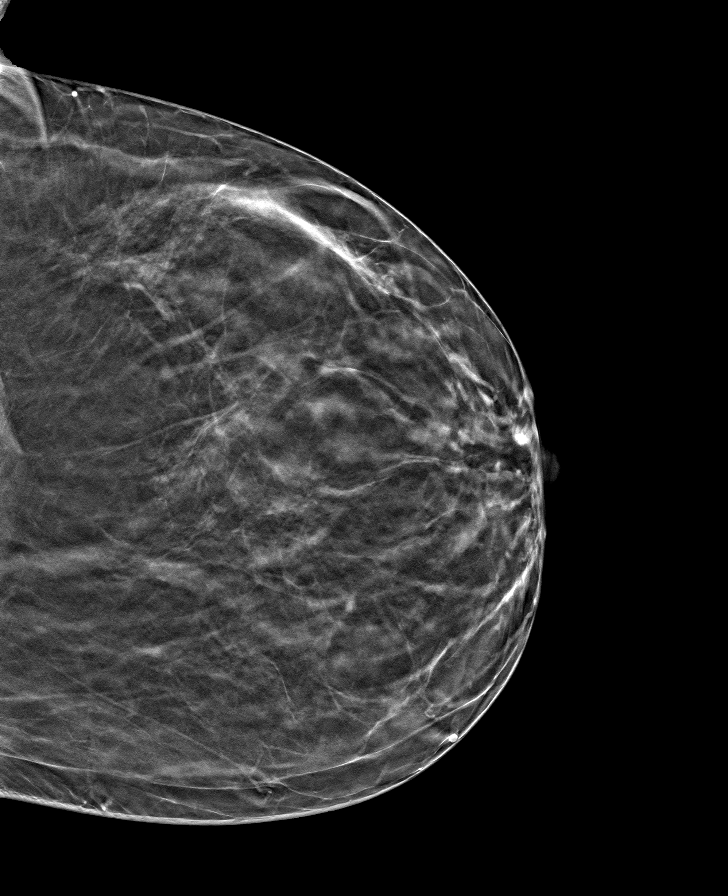

[R CC tomo · tomo slice 33/64.0]
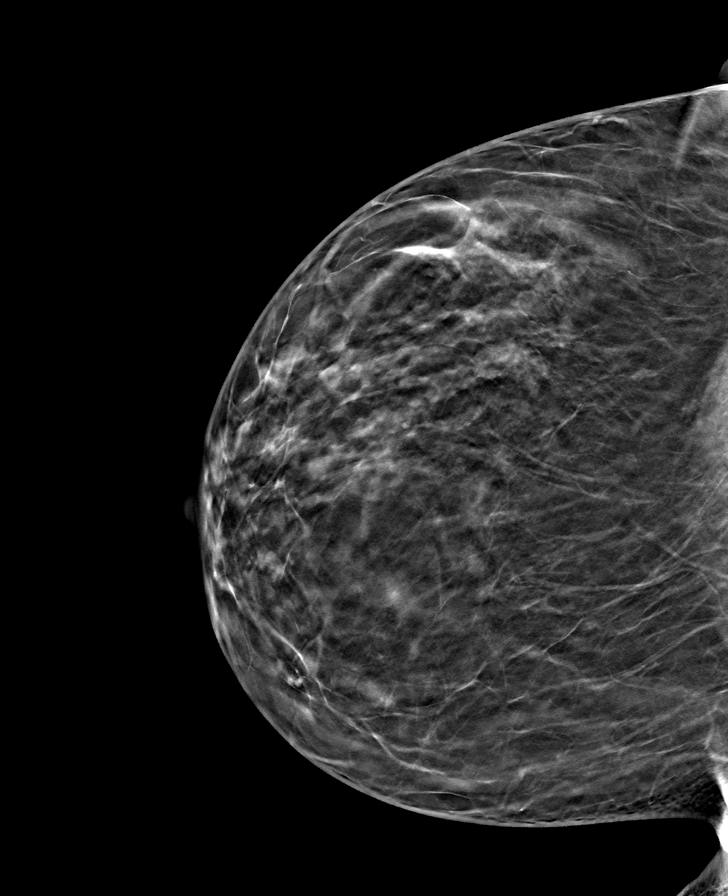

[L MLO tomo · tomo slice 37/73.0]
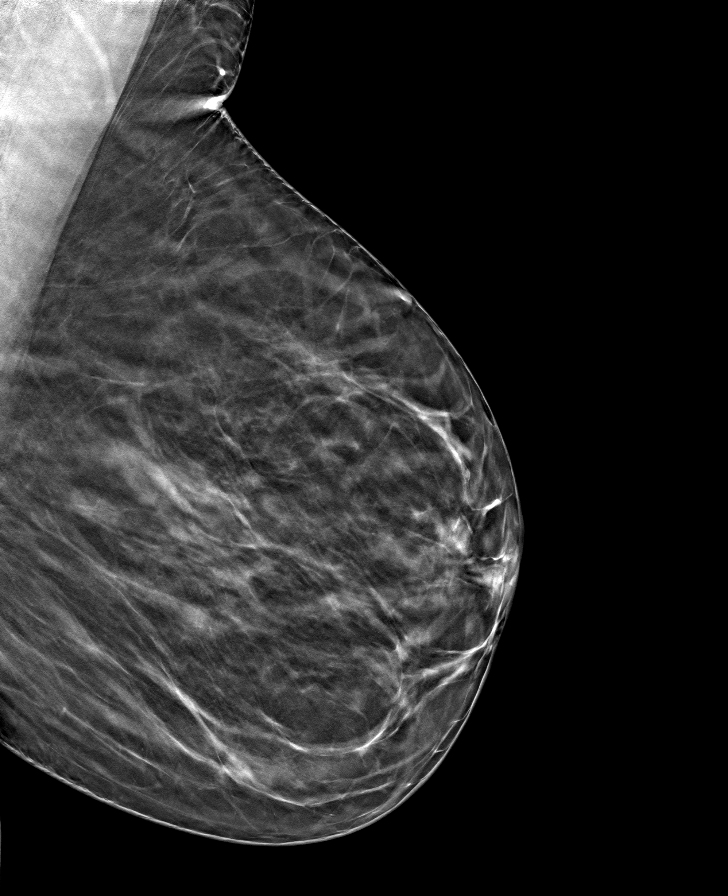

[R MLO tomo · tomo slice 37/73.0]
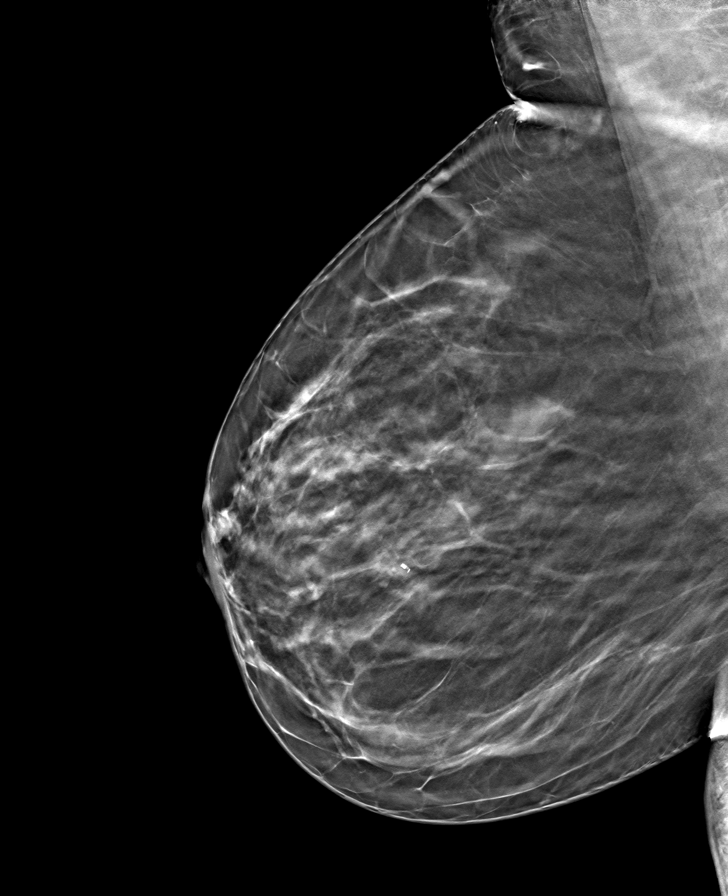

[8 of 24 positions shown; findings below may reference images not displayed]

ACR Breast Density Category b: There are scattered areas of
fibroglandular density.
FINDINGS: No suspicious changes are seen at the biopsy site in the central
slightly medial right breast. No suspicious calcifications, masses
or areas of distortion are seen in the bilateral breasts.
IMPRESSION: 1. No suspicious findings at the benign biopsy site in the right
breast.

2.  No mammographic evidence of malignancy in the bilateral breasts.

RECOMMENDATION:
Screening mammogram in one year.(Code:RX-P-17O)

I have discussed the findings and recommendations with the patient.
If applicable, a reminder letter will be sent to the patient
regarding the next appointment.

BI-RADS CATEGORY  1: Negative.

## 2024-01-06 ENCOUNTER — Ambulatory Visit: Payer: Self-pay

## 2024-01-06 NOTE — Telephone Encounter (Signed)
 CRM # [id number] Owner: None Status: Resolved Open  Priority: Routine Created on: 01/06/2024 11:45 AM By: Ingram, Jakiera K   Primary Information  Source  Patricia Burton (Patient)   Subject  Patricia Burton (Patient)   Topic  Clinical - Red Word Triage   Voided CRM from Changed Reason for Contact  [phone]    Communication  Red Word that prompted transfer to Nurse Triage: Pain and swelling in feet, patient cannot even walk on foot at all as it is in both feet but more so in right foot           FYI Only or Action Required?: FYI only for provider.  Patient was last seen in primary care on n/a.  Called Nurse Triage reporting Foot Swelling/pain- need PCP  Symptoms began mid July.  Interventions attempted: Rest, hydration, or home remedies and Ice/heat application.  Symptoms are: gradually worsening.  Triage Disposition: No disposition on file.  Patient/caregiver understands and will follow disposition?: Reason for Disposition  [1] SEVERE pain (e.g., excruciating, unable to do any normal activities) AND [2] not improved after 2 hours of pain medicine  Answer Assessment - Initial Assessment Questions 1. ONSET: When did the swelling start? (e.g., minutes, hours, days)     Mid July  2. LOCATION: What part of the leg is swollen?  Are both legs swollen or just one leg?     Both feet Right > Left  3. SEVERITY: How bad is the swelling? (e.g., localized; mild, moderate, severe)     Localized  4. REDNESS: Is there redness or signs of infection?     no 5. PAIN: Is the swelling painful to touch? If Yes, ask: How painful is it?   (Scale 1-10; mild, moderate or severe)     Yes severe- shooting pains 6. FEVER: Do you have a fever? If Yes, ask: What is it, how was it measured, and when did it start?      no 7. CAUSE: What do you think is causing the leg swelling?     Plantar fasciitis  10. OTHER SYMPTOMS: Do you have any other symptoms? (e.g., chest pain,  difficulty breathing)       Very painful to stand on it - limited mobility, toenails  Answer Assessment - Initial Assessment Questions 1. ONSET: When did the pain start?      Mid July 2. LOCATION: Where is the pain located?      Both feet 3. PAIN: How bad is the pain?    (Scale 1-10; or mild, moderate, severe)     severe 4. WORK OR EXERCISE: Has there been any recent work or exercise that involved this part of the body?      Yes but now is having to call out of work 5. CAUSE: What do you think is causing the foot pain?     ? Plantar fascitis  6. OTHER SYMPTOMS: Do you have any other symptoms? (e.g., leg pain, rash, fever, numbness)     Pain with standing or walking  Protocols used: Leg Swelling and Edema-A-AH, Foot Pain-A-AH

## 2024-01-07 ENCOUNTER — Encounter: Payer: Self-pay | Admitting: Family Medicine

## 2024-01-07 ENCOUNTER — Ambulatory Visit: Payer: Self-pay | Admitting: Family Medicine

## 2024-01-07 ENCOUNTER — Ambulatory Visit (INDEPENDENT_AMBULATORY_CARE_PROVIDER_SITE_OTHER): Admitting: Family Medicine

## 2024-01-07 VITALS — BP 118/74 | HR 104 | Temp 97.0°F | Ht 67.75 in | Wt 226.0 lb

## 2024-01-07 DIAGNOSIS — Z1159 Encounter for screening for other viral diseases: Secondary | ICD-10-CM

## 2024-01-07 DIAGNOSIS — Z131 Encounter for screening for diabetes mellitus: Secondary | ICD-10-CM | POA: Diagnosis not present

## 2024-01-07 DIAGNOSIS — M722 Plantar fascial fibromatosis: Secondary | ICD-10-CM | POA: Insufficient documentation

## 2024-01-07 DIAGNOSIS — Z1211 Encounter for screening for malignant neoplasm of colon: Secondary | ICD-10-CM

## 2024-01-07 DIAGNOSIS — Z13220 Encounter for screening for lipoid disorders: Secondary | ICD-10-CM | POA: Diagnosis not present

## 2024-01-07 DIAGNOSIS — J302 Other seasonal allergic rhinitis: Secondary | ICD-10-CM | POA: Insufficient documentation

## 2024-01-07 DIAGNOSIS — Z23 Encounter for immunization: Secondary | ICD-10-CM | POA: Diagnosis not present

## 2024-01-07 DIAGNOSIS — E119 Type 2 diabetes mellitus without complications: Secondary | ICD-10-CM | POA: Insufficient documentation

## 2024-01-07 LAB — LIPID PANEL
Cholesterol: 166 mg/dL (ref 0–200)
HDL: 46.4 mg/dL (ref >39.00)
LDL Cholesterol: 105 mg/dL — ABNORMAL HIGH (ref 0–99)
NonHDL: 120.05
Total CHOL/HDL Ratio: 4
Triglycerides: 75 mg/dL (ref 0.0–149.0)
VLDL: 15 mg/dL (ref 0.0–40.0)

## 2024-01-07 LAB — BASIC METABOLIC PANEL WITH GFR
BUN: 10 mg/dL (ref 6–23)
CO2: 32 meq/L (ref 19–32)
Calcium: 9.1 mg/dL (ref 8.4–10.5)
Chloride: 98 meq/L (ref 96–112)
Creatinine, Ser: 0.69 mg/dL (ref 0.40–1.20)
GFR: 105.08 mL/min (ref >60.00)
Glucose, Bld: 273 mg/dL — ABNORMAL HIGH (ref 70–99)
Potassium: 3.9 meq/L (ref 3.5–5.1)
Sodium: 135 meq/L (ref 135–145)

## 2024-01-07 LAB — HEMOGLOBIN A1C: Hgb A1c MFr Bld: 9.5 % — ABNORMAL HIGH (ref 4.6–6.5)

## 2024-01-07 MED ORDER — NAPROXEN 500 MG PO TABS
500.0000 mg | ORAL_TABLET | Freq: Two times a day (BID) | ORAL | 0 refills | Status: AC
Start: 2024-01-07 — End: ?

## 2024-01-07 MED ORDER — METFORMIN HCL 500 MG PO TABS: 500.0000 mg | ORAL_TABLET | Freq: Two times a day (BID) | ORAL | 3 refills | Status: AC

## 2024-01-07 NOTE — Assessment & Plan Note (Signed)
 I will switch her from ibuprofen  to naproxen  500 mg bid. I will request a reasonable accomodation at work to give her additional time for doing stretches and resting the foot over the next 2 weeks. I will refer her to podiatry for her to consider custom orthotics. She should continue ice massage and stretches.

## 2024-01-07 NOTE — Patient Instructions (Signed)
Plantar Warts Warts are small growths on the skin. When they happen on the bottom of the foot (sole), they are called plantar warts. Plantar warts often form in groups, with several small warts around a larger wart. They tend to form on the heel or the ball of the foot. They may grow into the deeper layers of skin or rise above the surface of the skin. Most warts are not painful and do not cause problems. In some cases, plantar warts may cause pain when you walk. They can also spread to other parts of the sole or other parts of your body. Warts often go away on their own. Treatment may be done if needed or desired. What are the causes? Plantar warts are caused by a type of virus called human papillomavirus (HPV). You may get the virus if: You walk barefoot. The risk of getting HPV is higher if your feet are wet. You have a break in the skin of your foot. HPV may attack this break. What increases the risk? You are more likely to get plantar warts if: You are between 70 and 28 years of age. You use public showers or locker rooms. Your body has a weak defense system (immune system). What are the signs or symptoms? Common symptoms of plantar warts include: Flat or slightly raised growths. These may have a rough surface and look like a callus. Pain when you stand or walk on your foot. How is this diagnosed? In many cases, a plantar wart can be diagnosed by a physical exam. In some cases, a biopsy may be taken. This is when a small piece of tissue is removed for testing. How is this treated? In many cases, warts do not need treatment. They may go away on their own with time. If treatment is needed or wanted, it may include: Putting medicated solutions, creams, or patches on the wart. These make the skin soft so that layers will shed away over time. In many cases, the medicine is put on once or twice a day and covered with a bandage. Cryotherapy. This involves freezing the wart with liquid  nitrogen. Burning the wart with: Laser treatment. An electrified probe (electrocautery). Injecting a medicine (Candida antigen) into the wart to help the body's immune system fight off the wart. Having surgery to remove the wart. Putting duct tape over the top of the wart (occlusion). You will leave the tape in place for as long as told by your health care provider. Then you will replace it with a new strip of tape. This is done until the wart goes away. Repeat treatment may be needed. In some cases, warts may go away and come back again. Follow these instructions at home: Apply medicated creams or solutions only as told by your provider. You may need to: Soak the affected area in warm water. Remove the top layer of softened skin before you put the medicine on. A pumice stone works well for removing the skin. Put a bandage over the affected area after you apply the medicine. Repeat the process every day or as told by your provider. Do not scratch or pick at a wart. Wash your hands after you touch a wart. If a wart is painful, try covering it with a bandage that has a hole in the middle. This helps to take pressure off the wart. Keep all follow-up visits. Some treatments may need to be done more than once to get rid of large warts. How is this prevented? To  help prevent warts: Wear shoes and socks. Change your socks every day. Keep your feet clean and dry. Do not walk barefoot in shared locker rooms, shower areas, or swimming pools. Check your feet often. Avoid direct contact with warts on other people. Contact a health care provider if: Your warts do not get better with treatment. You have redness, swelling, or pain at the site of a wart. You have bleeding from a wart that does not stop with light pressure. You have diabetes and you get a wart. This information is not intended to replace advice given to you by your health care provider. Make sure you discuss any questions you have with  your health care provider. Document Revised: 06/05/2022 Document Reviewed: 06/05/2022 Elsevier Patient Education  2024 [employer].

## 2024-01-07 NOTE — Progress Notes (Signed)
 [facility] [address] [place] [address] Dept: [phone] Dept Fax: [phone]  New Patient Office Visit  Subjective:    Patient ID: Patricia Burton, female    DOB: Dec 04, 1980, 43 y.o..   MRN: [id number]  Chief Complaint  Patient presents with   Establish Care    NP- establish care.  C/o having pain in both feet.     History of Present Illness:  Patient is in today to establish care. Patricia Burton was born in [place] , [place]. Her family moved to [place] when she was a young child. In her last 2 years of high school, she was also attending the [employer] in dance. She later attended a year at [employer] in liberal arts and dance, but could not afford to continue. 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.Patricia Burton has a SO. She has a daughter (20). She denies use of tobacco or drugs. She drinks 1-2 drinks on the weekend when out socially.  Patricia Burton was recently seen in urgent care and diagnosed with plantar fasciitis. She notes for several months, she has had pain in the soles of her feet, R>L. The pain is most intense fir4st thing in the morning. She has been using ibuprofen  800 mg TID, trying to rest her feet during the day, applying Biofreeze twice a day, and very recently was advised to use a frozen water bottle for icing and stretching her foot. She had been told she might need injections, but want to avoid this.  Past Medical History: Patient Active Problem List   Diagnosis Date Noted   Chronic back pain 07/01/2017   History of concussion 12/18/2016   Past Surgical History:  Procedure Laterality Date   CESAREAN SECTION     DILATION AND CURETTAGE OF UTERUS     OVARIAN CYST REMOVAL     Family History   Problem Relation Age of Onset   Breast cancer Neg Hx    Outpatient Medications Prior to Visit  Medication Sig Dispense Refill   cetirizine -pseudoephedrine  (ZYRTEC -D) 5-120 MG per tablet Take 1 tablet by mouth daily. 30 tablet 0   ibuprofen  (ADVIL ) 800 MG tablet Take 800 mg by mouth 3 (three) times daily.     Menthol, Topical Analgesic, (BIOFREEZE EX) Apply topically.     cyclobenzaprine  (FLEXERIL ) 5 MG tablet Take 1-2 tablets (5-10 mg total) by mouth 2 (two) times daily as needed for muscle spasms. 30 tablet 0   guaiFENesin  (MUCINEX ) 600 MG 12 hr tablet Take 2 tablets (1,200 mg total) by mouth 2 (two) times daily. (Patient not taking: Reported on 10/10/2016) 30 tablet 0   HYDROcodone -acetaminophen  (NORCO/VICODIN) 5-325 MG tablet Take 1-2 tablets by mouth every 4 (four) hours as needed for moderate pain or severe pain. 10 tablet 0   naproxen  (NAPROSYN ) 500 MG tablet Take 1 tablet (500 mg total) by mouth 2 (two) times daily with a meal. 30 tablet 0   No facility-administered medications prior to visit.   Allergies  Allergen Reactions   Other Anaphylaxis and Itching    epidural   Latex Other (See Comments) and Swelling    Other reaction(s): Generalized Edema (intolerance)   Pollen Extract Other (See Comments)    Red eyes, itchy throat   Sulfa  Antibiotics Other (See Comments)    Unknown   Objective:   Today's Vitals   01/07/24 1000  BP: 118/74  Pulse: (!) 104  Temp: (!) 97 F (36.1 C)  TempSrc: Temporal  SpO2: 100%  Weight: 226 lb (102.5 kg)  Height: 5' 7.75 (1.721 m)   Body mass index is 34.62 kg/m.   General: Well developed, well nourished. No acute distress. Feet: There is pain over the plantar aspect of the calcaneus, R>L. No foot deformity is noted. Psych: Alert and oriented. Normal mood and affect.  Health Maintenance Due  Topic Date Due   HIV Screening  Never done   Hepatitis C Screening  Never done   DTaP/Tdap/Td (1 - Tdap) Never done   Hepatitis B Vaccines (1  of 3 - 19+ 3-dose series) Never done   HPV VACCINES (1 - 3-dose SCDM series) Never done   Cervical Cancer Screening (HPV/Pap Cotest)  Never done   Colonoscopy  Never done   INFLUENZA VACCINE  01/03/2024     Assessment & Plan:   Problem List Items Addressed This Visit       Musculoskeletal and Integument   Plantar fasciitis - Primary   I will switch her from ibuprofen  to naproxen  500 mg bid. I will request a reasonable accomodation at work to give her additional time for doing stretches and resting the foot over the next 2 weeks. I will refer her to podiatry for her to consider custom orthotics. She should continue ice massage and stretches.      Relevant Medications   naproxen  (NAPROSYN ) 500 MG tablet   Other Relevant Orders   Ambulatory referral to Podiatry   Other Visit Diagnoses       Screening for lipid disorders       Relevant Orders   Lipid panel     Screening for diabetes mellitus (DM)       Relevant Orders   Basic metabolic panel with GFR   Hemoglobin A1c     Encounter for hepatitis C screening test for low risk patient       Relevant Orders   HCV Ab w Reflex to Quant PCR     Screening for colon cancer       Relevant Orders   Ambulatory referral to Gastroenterology     Need for Tdap vaccination       Relevant Orders   Tdap vaccine greater than or equal to 7yo IM (Completed)       Return for Annual preventative care.   Patricia CHRISTELLA Simpler, MD

## 2024-01-08 LAB — HCV AB W REFLEX TO QUANT PCR: HCV Ab: NONREACTIVE

## 2024-01-08 LAB — HCV INTERPRETATION

## 2024-01-09 ENCOUNTER — Telehealth: Payer: Self-pay | Admitting: Family Medicine

## 2024-01-09 NOTE — Telephone Encounter (Signed)
 Spoke to patient, advised that unfortunately we are unable to make her work accept the letter with recommendation for accommodations.  Advised for the second time to reach out to her HR dept to see what she can do.  Dm/cma

## 2024-01-09 NOTE — Telephone Encounter (Signed)
 Copied from CRM [id number]. Topic: General - Other >> Jan 09, 2024  8:40 AM Robinson H wrote: Reason for CRM: Patient states she was given a note from provider to give to her employer for accommodations, patient states she was told by employer that they will not accommodate request since it's not a workers comp case. Patient would like to know if the provider can tell her what happens next if the employer refuses to follow doctors note, please reach out.  [place] [phone]

## 2024-01-24 ENCOUNTER — Ambulatory Visit: Admitting: Family Medicine

## 2024-01-24 ENCOUNTER — Telehealth: Payer: Self-pay

## 2024-01-24 NOTE — Telephone Encounter (Signed)
 Called patient in regards to missing her appointment at providers request.  She states the due to her prior employer not accepting the extra time to rest request that she has since changed jobs and doesn't have insurance right now.  She has not started the Metformin  and advised her that through [facility] that the RX would only be $4.85 at [facility].  She states that she doesn't have the money.  I did explain that it is very important for her to take care of her health and she stated that she has changed her diet/eating habits.  She will make another appointment once she gets insurance.

## 2024-01-27 ENCOUNTER — Encounter: Payer: Self-pay | Admitting: Family Medicine

## 2024-07-03 ENCOUNTER — Ambulatory Visit (INDEPENDENT_AMBULATORY_CARE_PROVIDER_SITE_OTHER)

## 2024-07-03 ENCOUNTER — Other Ambulatory Visit: Payer: Self-pay

## 2024-07-03 ENCOUNTER — Inpatient Hospital Stay (HOSPITAL_COMMUNITY)
Admission: EM | Admit: 2024-07-03 | Source: Home / Self Care | Attending: Internal Medicine | Admitting: Internal Medicine

## 2024-07-03 ENCOUNTER — Encounter (HOSPITAL_COMMUNITY): Payer: Self-pay

## 2024-07-03 ENCOUNTER — Ambulatory Visit

## 2024-07-03 ENCOUNTER — Emergency Department (HOSPITAL_COMMUNITY): Payer: Self-pay

## 2024-07-03 VITALS — BP 162/87 | HR 127 | Temp 99.7°F

## 2024-07-03 DIAGNOSIS — S91302A Unspecified open wound, left foot, initial encounter: Secondary | ICD-10-CM

## 2024-07-03 DIAGNOSIS — A491 Streptococcal infection, unspecified site: Secondary | ICD-10-CM

## 2024-07-03 DIAGNOSIS — L089 Local infection of the skin and subcutaneous tissue, unspecified: Secondary | ICD-10-CM | POA: Insufficient documentation

## 2024-07-03 DIAGNOSIS — L03032 Cellulitis of left toe: Secondary | ICD-10-CM | POA: Diagnosis not present

## 2024-07-03 DIAGNOSIS — M869 Osteomyelitis, unspecified: Secondary | ICD-10-CM

## 2024-07-03 DIAGNOSIS — L97523 Non-pressure chronic ulcer of other part of left foot with necrosis of muscle: Secondary | ICD-10-CM

## 2024-07-03 DIAGNOSIS — R7989 Other specified abnormal findings of blood chemistry: Secondary | ICD-10-CM | POA: Diagnosis not present

## 2024-07-03 DIAGNOSIS — E11628 Type 2 diabetes mellitus with other skin complications: Secondary | ICD-10-CM

## 2024-07-03 DIAGNOSIS — L0390 Cellulitis, unspecified: Secondary | ICD-10-CM | POA: Diagnosis not present

## 2024-07-03 DIAGNOSIS — L02612 Cutaneous abscess of left foot: Secondary | ICD-10-CM

## 2024-07-03 DIAGNOSIS — E1142 Type 2 diabetes mellitus with diabetic polyneuropathy: Secondary | ICD-10-CM

## 2024-07-03 DIAGNOSIS — A419 Sepsis, unspecified organism: Secondary | ICD-10-CM | POA: Diagnosis present

## 2024-07-03 DIAGNOSIS — L03116 Cellulitis of left lower limb: Secondary | ICD-10-CM | POA: Diagnosis not present

## 2024-07-03 DIAGNOSIS — E119 Type 2 diabetes mellitus without complications: Secondary | ICD-10-CM | POA: Diagnosis not present

## 2024-07-03 DIAGNOSIS — E11621 Type 2 diabetes mellitus with foot ulcer: Secondary | ICD-10-CM

## 2024-07-03 HISTORY — DX: Anemia, unspecified: D64.9

## 2024-07-03 HISTORY — DX: Type 2 diabetes mellitus without complications: E11.9

## 2024-07-03 LAB — CBC WITH DIFFERENTIAL/PLATELET
Abs Immature Granulocytes: 0.07 10*3/uL (ref 0.00–0.07)
Basophils Absolute: 0 10*3/uL (ref 0.0–0.1)
Basophils Relative: 0 %
Eosinophils Absolute: 0 10*3/uL (ref 0.0–0.5)
Eosinophils Relative: 0 %
HCT: 38.5 % (ref 36.0–46.0)
Hemoglobin: 12.2 g/dL (ref 12.0–15.0)
Immature Granulocytes: 1 %
Lymphocytes Relative: 10 %
Lymphs Abs: 1.2 10*3/uL (ref 0.7–4.0)
MCH: 24.4 pg — ABNORMAL LOW (ref 26.0–34.0)
MCHC: 31.7 g/dL (ref 30.0–36.0)
MCV: 77 fL — ABNORMAL LOW (ref 80.0–100.0)
Monocytes Absolute: 0.8 10*3/uL (ref 0.1–1.0)
Monocytes Relative: 6 %
Neutro Abs: 10.5 10*3/uL — ABNORMAL HIGH (ref 1.7–7.7)
Neutrophils Relative %: 83 %
Platelets: 390 10*3/uL (ref 150–400)
RBC: 5 MIL/uL (ref 3.87–5.11)
RDW: 16.2 % — ABNORMAL HIGH (ref 11.5–15.5)
WBC: 12.6 10*3/uL — ABNORMAL HIGH (ref 4.0–10.5)
nRBC: 0 % (ref 0.0–0.2)

## 2024-07-03 LAB — COMPREHENSIVE METABOLIC PANEL WITH GFR
ALT: 38 U/L (ref 0–44)
AST: 29 U/L (ref 15–41)
Albumin: 3.4 g/dL — ABNORMAL LOW (ref 3.5–5.0)
Alkaline Phosphatase: 133 U/L — ABNORMAL HIGH (ref 38–126)
Anion gap: 15 (ref 5–15)
BUN: 10 mg/dL (ref 6–20)
CO2: 23 mmol/L (ref 22–32)
Calcium: 9.3 mg/dL (ref 8.9–10.3)
Chloride: 93 mmol/L — ABNORMAL LOW (ref 98–111)
Creatinine, Ser: 0.8 mg/dL (ref 0.44–1.00)
GFR, Estimated: >60 mL/min
Glucose, Bld: 441 mg/dL — ABNORMAL HIGH (ref 70–99)
Potassium: 4 mmol/L (ref 3.5–5.1)
Sodium: 130 mmol/L — ABNORMAL LOW (ref 135–145)
Total Bilirubin: 0.4 mg/dL (ref 0.0–1.2)
Total Protein: 8.1 g/dL (ref 6.5–8.1)

## 2024-07-03 LAB — BASIC METABOLIC PANEL WITH GFR
Anion gap: 12 (ref 5–15)
BUN: 9 mg/dL (ref 6–20)
CO2: 22 mmol/L (ref 22–32)
Calcium: 8.5 mg/dL — ABNORMAL LOW (ref 8.9–10.3)
Chloride: 94 mmol/L — ABNORMAL LOW (ref 98–111)
Creatinine, Ser: 0.73 mg/dL (ref 0.44–1.00)
GFR, Estimated: >60 mL/min
Glucose, Bld: 334 mg/dL — ABNORMAL HIGH (ref 70–99)
Potassium: 3.9 mmol/L (ref 3.5–5.1)
Sodium: 128 mmol/L — ABNORMAL LOW (ref 135–145)

## 2024-07-03 LAB — GLUCOSE, CAPILLARY: Glucose-Capillary: 344 mg/dL — ABNORMAL HIGH (ref 70–99)

## 2024-07-03 LAB — C-REACTIVE PROTEIN: CRP: 24.3 mg/dL — ABNORMAL HIGH

## 2024-07-03 LAB — CBG MONITORING, ED: Glucose-Capillary: 335 mg/dL — ABNORMAL HIGH (ref 70–99)

## 2024-07-03 LAB — I-STAT CG4 LACTIC ACID, ED
Lactic Acid, Venous: 1.2 mmol/L (ref 0.5–1.9)
Lactic Acid, Venous: 1.4 mmol/L (ref 0.5–1.9)

## 2024-07-03 LAB — SEDIMENTATION RATE: Sed Rate: 113 mm/h — ABNORMAL HIGH (ref 0–22)

## 2024-07-03 LAB — HCG, SERUM, QUALITATIVE: Preg, Serum: NEGATIVE

## 2024-07-03 MED ORDER — SODIUM CHLORIDE 0.9% FLUSH
3.0000 mL | Freq: Two times a day (BID) | INTRAVENOUS | Status: DC
  Administered 2024-07-03 – 2024-07-05 (×3): 3 mL via INTRAVENOUS

## 2024-07-03 MED ORDER — LACTATED RINGERS IV BOLUS: 1000.0000 mL | Freq: Once | INTRAVENOUS | Status: DC

## 2024-07-03 MED ORDER — SODIUM CHLORIDE 0.9% FLUSH: 3.0000 mL | INTRAVENOUS | Status: DC | PRN

## 2024-07-03 MED ORDER — SODIUM CHLORIDE 0.9 % IV SOLN
3.0000 g | Freq: Once | INTRAVENOUS | Status: AC
  Administered 2024-07-03: 3 g via INTRAVENOUS
  Filled 2024-07-03: qty 8

## 2024-07-03 MED ORDER — INSULIN ASPART 100 UNIT/ML IJ SOLN
0.0000 [IU] | Freq: Three times a day (TID) | INTRAMUSCULAR | Status: AC
  Administered 2024-07-04 – 2024-07-05 (×5): 3 [IU] via SUBCUTANEOUS
  Administered 2024-07-06: 1 [IU] via SUBCUTANEOUS
  Administered 2024-07-06: 2 [IU] via SUBCUTANEOUS
  Administered 2024-07-07 (×2): 1 [IU] via SUBCUTANEOUS
  Administered 2024-07-07: 7 [IU] via SUBCUTANEOUS
  Administered 2024-07-08 (×3): 2 [IU] via SUBCUTANEOUS
  Administered 2024-07-09: 3 [IU] via SUBCUTANEOUS
  Administered 2024-07-09 (×2): 2 [IU] via SUBCUTANEOUS
  Administered 2024-07-10 (×3): 1 [IU] via SUBCUTANEOUS
  Filled 2024-07-03 (×2): qty 3
  Filled 2024-07-03: qty 8
  Filled 2024-07-03: qty 1
  Filled 2024-07-03: qty 3
  Filled 2024-07-03: qty 1
  Filled 2024-07-03: qty 3
  Filled 2024-07-03 (×3): qty 2
  Filled 2024-07-03 (×2): qty 1
  Filled 2024-07-03: qty 5
  Filled 2024-07-03 (×3): qty 2

## 2024-07-03 MED ORDER — PIPERACILLIN-TAZOBACTAM 3.375 G IVPB
3.3750 g | Freq: Three times a day (TID) | INTRAVENOUS | Status: DC
  Administered 2024-07-04 – 2024-07-05 (×4): 3.375 g via INTRAVENOUS
  Filled 2024-07-03 (×4): qty 50

## 2024-07-03 MED ORDER — MORPHINE SULFATE (PF) 4 MG/ML IV SOLN
4.0000 mg | Freq: Once | INTRAVENOUS | Status: AC
  Administered 2024-07-03: 4 mg via INTRAVENOUS
  Filled 2024-07-03: qty 1

## 2024-07-03 MED ORDER — ACETAMINOPHEN 325 MG PO TABS
650.0000 mg | ORAL_TABLET | Freq: Four times a day (QID) | ORAL | Status: AC | PRN
  Administered 2024-07-04 – 2024-07-05 (×5): 650 mg via ORAL
  Filled 2024-07-03 (×5): qty 2

## 2024-07-03 MED ORDER — VANCOMYCIN HCL 1250 MG/250ML IV SOLN
1250.0000 mg | Freq: Two times a day (BID) | INTRAVENOUS | Status: DC
  Administered 2024-07-04 – 2024-07-07 (×8): 1250 mg via INTRAVENOUS
  Filled 2024-07-03 (×8): qty 250

## 2024-07-03 MED ORDER — INSULIN ASPART 100 UNIT/ML IJ SOLN: 3.0000 [IU] | INTRAMUSCULAR | Status: DC

## 2024-07-03 MED ORDER — VANCOMYCIN HCL IN DEXTROSE 1-5 GM/200ML-% IV SOLN
1000.0000 mg | Freq: Once | INTRAVENOUS | Status: AC
  Administered 2024-07-03: 1000 mg via INTRAVENOUS
  Filled 2024-07-03: qty 200

## 2024-07-03 MED ORDER — LACTATED RINGERS IV SOLN: INTRAVENOUS | Status: AC

## 2024-07-03 MED ORDER — ONDANSETRON HCL 4 MG/2ML IJ SOLN
4.0000 mg | Freq: Four times a day (QID) | INTRAMUSCULAR | Status: AC | PRN
  Administered 2024-07-03 – 2024-07-05 (×3): 4 mg via INTRAVENOUS
  Filled 2024-07-03 (×3): qty 2

## 2024-07-03 MED ORDER — ENOXAPARIN SODIUM 40 MG/0.4ML IJ SOSY
40.0000 mg | PREFILLED_SYRINGE | INTRAMUSCULAR | Status: AC
  Administered 2024-07-03 – 2024-07-10 (×5): 40 mg via SUBCUTANEOUS
  Filled 2024-07-03 (×8): qty 0.4

## 2024-07-03 MED ORDER — LABETALOL HCL 5 MG/ML IV SOLN: 10.0000 mg | Freq: Three times a day (TID) | INTRAVENOUS | Status: AC | PRN

## 2024-07-03 MED ORDER — ACETAMINOPHEN 650 MG RE SUPP: 650.0000 mg | Freq: Four times a day (QID) | RECTAL | Status: AC | PRN

## 2024-07-03 MED ORDER — LACTATED RINGERS IV BOLUS
1000.0000 mL | INTRAVENOUS | Status: AC
  Administered 2024-07-04: 1000 mL via INTRAVENOUS

## 2024-07-03 MED ORDER — ONDANSETRON HCL 4 MG/2ML IJ SOLN
4.0000 mg | Freq: Once | INTRAMUSCULAR | Status: AC
  Administered 2024-07-03: 4 mg via INTRAVENOUS
  Filled 2024-07-03: qty 2

## 2024-07-03 MED ORDER — SODIUM CHLORIDE 0.9 % IV SOLN: 250.0000 mL | INTRAVENOUS | Status: AC | PRN

## 2024-07-03 MED ORDER — SODIUM CHLORIDE 0.9% FLUSH
3.0000 mL | Freq: Two times a day (BID) | INTRAVENOUS | Status: AC
  Administered 2024-07-03 – 2024-07-10 (×8): 3 mL via INTRAVENOUS

## 2024-07-03 MED ORDER — VANCOMYCIN HCL 500 MG/100ML IV SOLN
500.0000 mg | Freq: Once | INTRAVENOUS | Status: AC
  Administered 2024-07-03: 500 mg via INTRAVENOUS
  Filled 2024-07-03 (×2): qty 100

## 2024-07-03 MED ORDER — LACTATED RINGERS IV BOLUS
500.0000 mL | INTRAVENOUS | Status: AC
  Administered 2024-07-03: 500 mL via INTRAVENOUS

## 2024-07-03 MED ORDER — MORPHINE SULFATE (PF) 2 MG/ML IV SOLN
2.0000 mg | INTRAVENOUS | Status: AC | PRN
  Administered 2024-07-03 – 2024-07-07 (×8): 2 mg via INTRAVENOUS
  Filled 2024-07-03 (×8): qty 1

## 2024-07-03 MED ORDER — INSULIN ASPART 100 UNIT/ML IJ SOLN
0.0000 [IU] | Freq: Every day | INTRAMUSCULAR | Status: AC
  Administered 2024-07-03: 4 [IU] via SUBCUTANEOUS
  Administered 2024-07-04 – 2024-07-05 (×2): 2 [IU] via SUBCUTANEOUS
  Administered 2024-07-06: 3 [IU] via SUBCUTANEOUS
  Filled 2024-07-03: qty 5
  Filled 2024-07-03 (×2): qty 2
  Filled 2024-07-03: qty 3

## 2024-07-03 MED ORDER — IOHEXOL 350 MG/ML SOLN
75.0000 mL | Freq: Once | INTRAVENOUS | Status: AC | PRN
  Administered 2024-07-03: 75 mL via INTRAVENOUS

## 2024-07-03 MED ORDER — PIPERACILLIN-TAZOBACTAM 3.375 G IVPB 30 MIN
3.3750 g | Freq: Once | INTRAVENOUS | Status: AC
  Administered 2024-07-03: 3.375 g via INTRAVENOUS
  Filled 2024-07-03: qty 50

## 2024-07-03 MED ORDER — LACTATED RINGERS IV BOLUS
1000.0000 mL | Freq: Once | INTRAVENOUS | Status: AC
  Administered 2024-07-03: 1000 mL via INTRAVENOUS

## 2024-07-03 MED ORDER — INSULIN ASPART 100 UNIT/ML IJ SOLN: 3.0000 [IU] | Freq: Once | INTRAMUSCULAR | Status: DC

## 2024-07-03 MED ORDER — ONDANSETRON HCL 4 MG PO TABS: 4.0000 mg | ORAL_TABLET | Freq: Four times a day (QID) | ORAL | Status: AC | PRN

## 2024-07-03 NOTE — Plan of Care (Signed)

## 2024-07-03 NOTE — Inpatient Diabetes Management (Signed)
 Inpatient Diabetes Program Recommendations  AACE/ADA: New Consensus Statement on Inpatient Glycemic Control (2015)  Target Ranges:  Prepandial:   less than 140 mg/dL      Peak postprandial:   less than 180 mg/dL (1-2 hours)      Critically ill patients:  140 - 180 mg/dL   Lab Results  Component Value Date   HGBA1C 9.5 (H) 01/07/2024    Review of Glycemic Control  Latest Reference Range & Units 07/03/24 10:26  Glucose 70 - 99 mg/dL 558 (H)  (H): Data is abnormally high Diabetes history: Type 2 DM Outpatient Diabetes medications: Metformin  500 mg BID Current orders for Inpatient glycemic control: none  Inpatient Diabetes Program Recommendations:    Consider adding A1C and Novolog  0-9 units Q4H.  Thanks, Tinnie Minus, MSN, RNC-OB Diabetes Coordinator [phone] (8a-5p)

## 2024-07-03 NOTE — ED Notes (Signed)
 Unable to get an iv  charge notified  iv team consult

## 2024-07-03 NOTE — Progress Notes (Signed)
 Pharmacy Antibiotic Note  Patricia Burton is a 44 y.o. female admitted on 07/03/2024 presenting with infected foot ulcer.  Pharmacy has been consulted for vancomycin  dosing. Vancomycin  1g Iv x 1 given in ED  Plan: Vancomycin  500 mg Iv x 1 to complete loading dose, then 1250 mg IV q 12h (eAUC 470) Monitor renal function, Cx and podiatry recs Vancomycin  levels as indicated  Height: 5' 9 (175.3 cm) Weight: 90.7 kg (200 lb) IBW/kg (Calculated) : 66.2  Temp (24hrs), Avg:98.9 F (37.2 C), Min:97.6 F (36.4 C), Max:99.7 F (37.6 C)  Recent Labs  Lab 07/03/24 1026 07/03/24 1049 07/03/24 1759  WBC 12.6*  --   --   CREATININE 0.80  --   --   LATICACIDVEN  --  1.2 1.4    Estimated Creatinine Clearance: 106.5 mL/min (by C-G formula based on SCr of 0.8 mg/dL).    Allergies[1]  Dorn Poot, PharmD, [facility] Clinical Pharmacist ED Pharmacist Phone # [phone] 07/03/2024 8:25 PM     [1]  Allergies Allergen Reactions   Other Anaphylaxis and Itching    epidural   Latex Other (See Comments) and Swelling    Other reaction(s): Generalized Edema (intolerance)   Pollen Extract Other (See Comments)    Red eyes, itchy throat   Sulfa Antibiotics Other (See Comments)    Unknown

## 2024-07-03 NOTE — ED Notes (Signed)
Report called to rn on 2w 

## 2024-07-03 NOTE — Progress Notes (Signed)
 Patient originally in lobby and brought to treatment room in ED where I met the patient. I have reviewed the chart. I went over the plan with the patient. Will get CT foot/ankle tonight, recommend going ahead and starting antibiotics. Will plan for the OR tomorrow morning, scheduled for 7:30am. I reviewed with the patient at least need for I&D, but risk of amputation. We will await the CT scan and I will discuss with her final plans for surgery in the morning prior to surgery.   She had questions regarding work and how this has impacted her life. Right now we need to get more details as to the extent of the infection. We discussed likely need to return to the OR for serial debridement.   Her daughter was with her today and I answered all their questions.

## 2024-07-03 NOTE — ED Provider Notes (Signed)
 " [place] [facility] DE[facility]MENT AT [facility] Provider Note   CSN: [id number] Arrival date & time: 07/03/24  1003     Patient presents with: Foot Pain and Wound Infection   Patricia Burton is a 44 y.o. female.    Foot Pain     Patient states she sustained a wound to her foot about a week ago.  Patient was treating the wound with topical medications.  Patient started noticing increasing pain in her foot in the last few days.  She also started to notice increased swelling and redness.  Patient developed a wound.  She went to see a podiatrist today.  They were concerned about severe infection and she was instructed to come to the ER to be admitted to the hospital.  Plan is for surgical intervention tomorrow.  Patient denies any recent fevers.  Prior to Admission medications  Medication Sig Start Date End Date Taking? Authorizing Provider  b complex vitamins capsule Take 1 capsule by mouth daily.   Yes [provider]  IRON, FERROUS SULFATE, PO Take 1 tablet by mouth daily.   Yes [provider]  Menthol, Topical Analgesic, (BIOFREEZE EX) Apply 1 Application topically as needed (pain).   Yes [provider]  metFORMIN  (GLUCOPHAGE ) 500 MG tablet Take 1 tablet (500 mg total) by mouth 2 (two) times daily with a meal. 01/07/24  Yes Rudd, Garnette HERO, MD  cetirizine -pseudoephedrine  (ZYRTEC -D) 5-120 MG per tablet Take 1 tablet by mouth daily. Patient not taking: Reported on 07/03/2024 07/21/12   Dammen, Peter, PA-C  ibuprofen  (ADVIL ) 800 MG tablet Take 800 mg by mouth 3 (three) times daily. Patient not taking: Reported on 07/03/2024 12/26/23   [provider]  naproxen  (NAPROSYN ) 500 MG tablet Take 1 tablet (500 mg total) by mouth 2 (two) times daily with a meal. Patient not taking: Reported on 07/03/2024 01/07/24   Thedora Garnette HERO, MD    Allergies: Other, Latex, Pollen extract, and Sulfa antibiotics    Review of Systems  Updated Vital Signs BP (!)  138/90 (BP Location: Left Arm)   Pulse (!) 127   Temp 100.2 F (37.9 C)   Resp 14   Ht 1.753 m (5' 9)   Wt 90.7 kg   LMP 07/03/2024   SpO2 99%   BMI 29.53 kg/m   Physical Exam Vitals and nursing note reviewed.  Constitutional:      General: She is not in acute distress.    Appearance: She is well-developed.  HENT:     Head: Normocephalic and atraumatic.     Right Ear: External ear normal.     Left Ear: External ear normal.  Eyes:     General: No scleral icterus.       Right eye: No discharge.        Left eye: No discharge.     Conjunctiva/sclera: Conjunctivae normal.  Neck:     Trachea: No tracheal deviation.  Cardiovascular:     Rate and Rhythm: Normal rate and regular rhythm.  Pulmonary:     Effort: Pulmonary effort is normal. No respiratory distress.     Breath sounds: Normal breath sounds. No stridor. No wheezing or rales.  Abdominal:     General: Bowel sounds are normal. There is no distension.     Palpations: Abdomen is soft.     Tenderness: There is no abdominal tenderness. There is no guarding or rebound.  Musculoskeletal:        General: Swelling and tenderness  present. No deformity.     Cervical back: Neck supple.     Comments: Ulcerative wounds noted in the fifth toe, significant swelling tenderness and induration of the left foot and left lower leg  Skin:    General: Skin is warm and dry.     Findings: No rash.  Neurological:     General: No focal deficit present.     Mental Status: She is alert.     Cranial Nerves: No cranial nerve deficit, dysarthria or facial asymmetry.     Sensory: No sensory deficit.     Motor: No abnormal muscle tone or seizure activity.     Coordination: Coordination normal.  Psychiatric:        Mood and Affect: Mood normal.     (all labs ordered are listed, but only abnormal results are displayed) Labs Reviewed  COMPREHENSIVE METABOLIC PANEL WITH GFR - Abnormal; Notable for the following components:      Result Value    Sodium 130 (*)    Chloride 93 (*)    Glucose, Bld 441 (*)    Albumin 3.4 (*)    Alkaline Phosphatase 133 (*)    All other components within normal limits  CBC WITH DIFFERENTIAL/PLATELET - Abnormal; Notable for the following components:   WBC 12.6 (*)    MCV 77.0 (*)    MCH 24.4 (*)    RDW 16.2 (*)    Neutro Abs 10.5 (*)    All other components within normal limits  C-REACTIVE PROTEIN - Abnormal; Notable for the following components:   CRP 24.3 (*)    All other components within normal limits  BASIC METABOLIC PANEL WITH GFR - Abnormal; Notable for the following components:   Sodium 128 (*)    Chloride 94 (*)    Glucose, Bld 334 (*)    Calcium 8.5 (*)    All other components within normal limits  GLUCOSE, CAPILLARY - Abnormal; Notable for the following components:   Glucose-Capillary 344 (*)    All other components within normal limits  CBG MONITORING, ED - Abnormal; Notable for the following components:   Glucose-Capillary 335 (*)    All other components within normal limits  CULTURE, BLOOD (ROUTINE X 2)  CULTURE, BLOOD (ROUTINE X 2)  HCG, SERUM, QUALITATIVE  SEDIMENTATION RATE  HEMOGLOBIN A1C  CBC  COMPREHENSIVE METABOLIC PANEL WITH GFR  HIV ANTIBODY (ROUTINE TESTING W REFLEX)  URINE DRUG SCREEN  ETHANOL  I-STAT CG4 LACTIC ACID, ED  I-STAT CG4 LACTIC ACID, ED  I-STAT CG4 LACTIC ACID, ED  I-STAT CG4 LACTIC ACID, ED    EKG: EKG Interpretation Date/Time:  Friday July 03 2024 16:41:59 EST Ventricular Rate:  119 PR Interval:  87 QRS Duration:  82 QT Interval:  323 QTC Calculation: 455 R Axis:   66  Text Interpretation: Sinus tachycardia Nonspecific T abnormalities, lateral leads Confirmed by Randol Simmonds [phone]) on 07/03/2024 4:43:43 PM  Radiology: CT ANKLE LEFT W CONTRAST Result Date: 07/03/2024 EXAM: CT LEFT ANKLE, WITH IV CONTRAST 07/03/2024 06:37:29 PM TECHNIQUE: Axial images were acquired through the left ankle with IV contrast. Reformatted images were  reviewed. Automated exposure control, iterative reconstruction, and/or weight based adjustment of the mA/kV was utilized to reduce the radiation dose to as low as reasonably achievable. COMPARISON: Radiographs 07/03/2024. CLINICAL HISTORY: Painful wound infection. FINDINGS: BONES: No acute fracture or focal osseous lesion. No bony destructive findings in the ankle characteristic of osteomyelitis. Plantar calcaneal spur and incidental os trigonum noted. No malalignment. JOINTS:  No dislocation. The joint spaces are normal. SOFT TISSUES: Mild distal Achilles tendinopathy. Medial band of the plantar fascia proximally may reflect plantar fasciitis. Relatively atrophic proximal abductor digiti minimi muscle raising suspicion for Baxter neuropathy. Mild circumferential subcutaneous edema at the ankle, and tracking into the dorsum of the foot and along the plantar heel. Cellulitis is not excluded but I don't observe a drainable abscess. No abnormal gas tracking in the soft tissues. IMPRESSION: 1. No CT evidence of osteomyelitis, drainable abscess, or soft tissue gas. 2. Mild circumferential subcutaneous edema at the ankle extending into the dorsum of the foot and along the plantar heel, which may reflect cellulitis. 3. Mild distal Achilles tendinopathy and suspected proximal plantar fasciitis involving the medial band of the plantar fascia. 4. Relative atrophy of the proximal abductor digiti minimi muscle, suspicious for Baxter neuropathy. 5. Plantar calcaneal spur. Electronically signed by: Ryan Salvage MD 07/03/2024 06:58 PM EST RP Workstation: [id number]   CT FOOT LEFT W CONTRAST Result Date: 07/03/2024 EXAM: CT LEFT FOOT, WITH IV CONTRAST 07/03/2024 06:37:29 PM TECHNIQUE: Axial images were acquired through the left foot with IV contrast. Reformatted images were reviewed. Automated exposure control, iterative reconstruction, and/or weight based adjustment of the mA/kV was utilized to reduce the radiation dose to  as low as reasonably achievable. COMPARISON: Radiographs 07/03/2024. CLINICAL HISTORY: Osteomyelitis, foot. FINDINGS: BONES AND JOINTS: No acute fracture or focal osseous lesion. No dislocation. The joint spaces are normal. No definite bony or destructive findings indicative of osteomyelitis. No mild alignment. SOFT TISSUES: Motion artifact is present, reducing diagnostic sensitivity and specificity. Soft tissue swelling and subcutaneous edema in the small toe and tracking back along the soft tissues plantar lateral to the 5th metatarsophalangeal joint. There is some cutaneous irregularity which may be from ulceration, as well as adjacent bandaging. Dorsal soft tissue swelling in the forefoot tracking into the toes. Cellulitis is not excluded. No drainable abscess is identified. No gas tracking in the soft tissues of the small toe. IMPRESSION: 1. No CT evidence of osteomyelitis or acute osseous abnormality. 2. Soft tissue swelling and subcutaneous edema of the small toe and plantar lateral to the fifth metatarsophalangeal joint with possible ulceration and adjacent bandaging, without soft tissue gas or drainable abscess; cellulitis is not excluded. Dorsal subcutaneous edema along the forefoot. 3. Motion artifact, reducing diagnostic sensitivity and specificity. Electronically signed by: Ryan Salvage MD 07/03/2024 06:54 PM EST RP Workstation: [id number]     Procedures   Medications Ordered in the ED  insulin  aspart (novoLOG ) injection 0-9 Units (has no administration in time range)  insulin  aspart (novoLOG ) injection 0-5 Units (4 Units Subcutaneous Given 07/03/24 2238)  lactated ringers  infusion ( Intravenous New Bag/Given 07/03/24 2224)  piperacillin -tazobactam (ZOSYN ) IVPB 3.375 g (0 g Intravenous Stopped 07/03/24 2138)    Followed by  piperacillin -tazobactam (ZOSYN ) IVPB 3.375 g (has no administration in time range)  lactated ringers  bolus 1,000 mL (has no administration in time range)  enoxaparin   (LOVENOX ) injection 40 mg (40 mg Subcutaneous Given 07/03/24 2110)  sodium chloride  flush (NS) 0.9 % injection 3 mL (3 mLs Intravenous Given 07/03/24 2225)  sodium chloride  flush (NS) 0.9 % injection 3 mL (3 mLs Intravenous Given 07/03/24 2225)  sodium chloride  flush (NS) 0.9 % injection 3 mL (has no administration in time range)  0.9 %  sodium chloride  infusion (has no administration in time range)  acetaminophen  (TYLENOL ) tablet 650 mg (has no administration in time range)    Or  acetaminophen  (TYLENOL ) suppository 650 mg (  has no administration in time range)  morphine  (PF) 2 MG/ML injection 2 mg (2 mg Intravenous Given 07/03/24 2258)  ondansetron  (ZOFRAN ) tablet 4 mg ( Oral See Alternative 07/03/24 2258)    Or  ondansetron  (ZOFRAN ) injection 4 mg (4 mg Intravenous Given 07/03/24 2258)  labetalol  (NORMODYNE ) injection 10 mg (has no administration in time range)  vancomycin  (VANCOREADY) IVPB 500 mg/100 mL (500 mg Intravenous New Bag/Given 07/03/24 2249)  vancomycin  (VANCOREADY) IVPB 1250 mg/250 mL (has no administration in time range)  morphine  (PF) 4 MG/ML injection 4 mg (4 mg Intravenous Given 07/03/24 1759)  ondansetron  (ZOFRAN ) injection 4 mg (4 mg Intravenous Given 07/03/24 1758)  Ampicillin -Sulbactam (UNASYN ) 3 g in sodium chloride  0.9 % 100 mL IVPB (0 g Intravenous Stopped 07/03/24 1939)    And  vancomycin  (VANCOCIN ) IVPB 1000 mg/200 mL premix (0 mg Intravenous Stopped 07/03/24 2100)  lactated ringers  bolus 1,000 mL (0 mLs Intravenous Stopped 07/03/24 1939)  iohexol  (OMNIPAQUE ) 350 MG/ML injection 75 mL (75 mLs Intravenous Contrast Given 07/03/24 1837)  lactated ringers  bolus 500 mL (500 mLs Intravenous New Bag/Given 07/03/24 2105)    Clinical Course as of 07/03/24 2327  Fri Jul 03, 2024  1932 CT scan without signs of osteomyelitis [JK]    Clinical Course User Index [JK] Randol Simmonds, MD                                 Medical Decision Making Problems Addressed: Cellulitis of left lower  extremity: acute illness or injury that poses a threat to life or bodily functions  Amount and/or Complexity of Data Reviewed Labs: ordered. Decision-making details documented in ED Course. Radiology: ordered and independent interpretation performed.  Risk Prescription drug management. Decision regarding hospitalization.   Patient presented to the ED for evaluation of a infection in her left lower extremity.  Patient was evaluated by Dr. Alona today.  Was concerned about the possibility of deep space infection in her foot.  Patient noted to have leukocytosis but no signs of lactic acidosis.  No signs of sepsis or systemic infection at this time.  CT scan does not show any evidence of abscess or osteomyelitis.  Patient with signs of extensive cellulitis on exam.  Will admit to the hospital for IV antibiotics and further treatment.  Case discussed with Dr. Sundil regarding admission    Final diagnoses:  Cellulitis of left lower extremity    ED Discharge Orders     None          Randol Simmonds, MD 07/03/24 2330  "

## 2024-07-03 NOTE — Plan of Care (Signed)
 Persistent tachycardia Heart rate in between 120-140s range.  EKG showing sinus tachycardia heart rat heart rate 119.  Borderline hypertensive. - Checking UDS, blood alcohol level, TSH and free T4. -Continue cardiac monitoring and development of arrhythmia.
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
## 2024-07-03 NOTE — ED Notes (Signed)
"    Reports given to the floor nurse upstairs "

## 2024-07-03 NOTE — H&P (Signed)
 " History and Physical    Patricia Burton FMW:[id number] DOB: 1980/12/06 DOA: 07/03/2024  PCP: Thedora Garnette HERO, MD   Patient coming from: Home   Chief Complaint:  Chief Complaint  Patient presents with   Foot Pain   Wound Infection   ED TRIAGE note:  HPI:  Patricia Burton is a 44 y.o. female with medical history significant of non-insulin -dependent DM type II, chronic pain syndrome presented to emergency department referred from podiatry clinic for evaluation for left-sided foot infection. Patient reported that she sustained a wound of the left foot a week ago and has been treating with topical antibiotics without improvement.  Patient reported increasing pain, swelling and discharge from the wound area and she is being evaluated by podiatry clinic referred to ED for evaluation.  Patient reported that she had left stepped on repeatedly for last few days that cause increasing redness swelling and pain of the left lower extremity. Patient denies any fever and chill.  In the ED patient found borderline hypertensive and tachycardic however patient denies any chest pain, palpitation or shortness of breath.  Denies any history of hypertension and irregular heart rate.  Patient complaining about mild headache otherwise no complaint at this time except left-sided foot pain.  In the ED patient has been evaluated by on-call podiatry Dr. Gershon obtaining CT scan of the foot and ankle and plan to take OR tomorrow for I&D however patient has high risk for amputation.  Significant labs in the ED:  At presentation to ED patient is persistently tachycardic heart rate up to 141 improved to 121 borderline hypertensive.  Afebrile.  EKG shows sinus tachycardia heart rate 119.  There is no ST and T wave abnormality. Lab work, CBC showing low sodium 130, elevated blood glucose 441, low albumin 3.4 and elevated alkaline phosphatase 133.  CBC showed leukocytosis 12.6, otherwise unremarkable.  Pregnancy test  negative.  Normal lactic acid level.  Blood cultures are in process.  CT scan of the foot and left ankle: 1. No CT evidence of osteomyelitis or acute osseous abnormality. 2. Soft tissue swelling and subcutaneous edema of the small toe and plantar lateral to the fifth metatarsophalangeal joint with possible ulceration and adjacent bandaging, without soft tissue gas or drainable abscess; cellulitis is not excluded. Dorsal subcutaneous edema along the forefoot. 3. Motion artifact, reducing diagnostic sensitivity and specificity.  In the ED patient received Unasyn , vancomycin .  1 L of LR bolus, morphine , and Zofran .   Hospitalist consulted for further evaluation management of sepsis in the setting of left foot infection, hyponatremia and hyperglycemia.   Lab Orders         Blood culture (routine x 2)         Comprehensive metabolic panel         CBC with Differential         hCG, serum, qualitative         C-reactive protein         Sedimentation rate         Hemoglobin A1c         CBC         Comprehensive metabolic panel         HIV Antibody (routine testing w rflx)         Basic metabolic panel         I-Stat CG4 Lactic Acid       Review of Systems:  Review of Systems  Constitutional:  Negative for chills, fever  and malaise/fatigue.  Respiratory:  Negative for sputum production and shortness of breath.   Cardiovascular:  Negative for chest pain and palpitations.  Gastrointestinal:  Negative for heartburn and nausea.  Genitourinary:  Negative for frequency.  Musculoskeletal:        Left foot pain redness  Skin:  Negative for rash.  Neurological:  Negative for dizziness and headaches.  Endo/Heme/Allergies:  Negative for polydipsia.  Psychiatric/Behavioral:  The patient is not nervous/anxious.     Past Medical History:  Diagnosis Date   Allergy     Past Surgical History:  Procedure Laterality Date   CESAREAN SECTION     DILATION AND CURETTAGE OF UTERUS     OVARIAN  CYST REMOVAL       reports that she has never smoked. She has never used smokeless tobacco. She reports current alcohol use. She reports that she does not use drugs.  Allergies[1]  Family History  Problem Relation Age of Onset   Kidney disease Mother    Diabetes Mother    Heart disease Father    Hyperlipidemia Father    Hypertension Father    Diabetes Sister    Breast cancer Neg Hx     Prior to Admission medications  Medication Sig Start Date End Date Taking? Authorizing Provider  cetirizine -pseudoephedrine  (ZYRTEC -D) 5-120 MG per tablet Take 1 tablet by mouth daily. 07/21/12   Dammen, Peter, PA-C  ibuprofen  (ADVIL ) 800 MG tablet Take 800 mg by mouth 3 (three) times daily. 12/26/23   [provider]  Menthol, Topical Analgesic, (BIOFREEZE EX) Apply topically.    [provider]  metFORMIN  (GLUCOPHAGE ) 500 MG tablet Take 1 tablet (500 mg total) by mouth 2 (two) times daily with a meal. 01/07/24   Thedora Garnette HERO, MD  naproxen  (NAPROSYN ) 500 MG tablet Take 1 tablet (500 mg total) by mouth 2 (two) times daily with a meal. 01/07/24   Thedora Garnette HERO, MD     Physical Exam: Vitals:   07/03/24 1700 07/03/24 1715 07/03/24 1938 07/03/24 1945  BP: (!) 149/82 (!) 164/96 (!) 164/97 (!) 174/91  Pulse: (!) 115 (!) 128 (!) 118 (!) 121  Resp: 20 (!) 23 17 20   Temp:   99 F (37.2 C)   TempSrc:   Oral   SpO2: 100% 100% 99% 99%  Weight:      Height:        Physical Exam Vitals and nursing note reviewed.  Constitutional:      General: She is not in acute distress.    Appearance: She is not ill-appearing.  HENT:     Mouth/Throat:     Mouth: Mucous membranes are moist.  Eyes:     Pupils: Pupils are equal, round, and reactive to light.  Cardiovascular:     Rate and Rhythm: Tachycardia present.  Pulmonary:     Effort: Pulmonary effort is normal.  Abdominal:     Palpations: Abdomen is soft.  Musculoskeletal:        General: Tenderness present. No signs of injury.      Cervical back: Neck supple.     Right lower leg: No edema.     Left lower leg: No edema.  Skin:    Capillary Refill: Capillary refill takes less than 2 seconds.  Neurological:     Mental Status: She is alert and oriented to person, place, and time.      Media Information   Document Information  Photographic Image: Photos    07/03/2024 08:40  Attached  To:  Office Visit on 07/03/24 with Regal, Prentice PARAS, DPM  Source Information  Regal, Prentice PARAS, DPM  [facility]    Media Information   Document Information  Photographic Image: Photos    07/03/2024 08:40  Attached To:  Office Visit on 07/03/24 with Magdalen Prentice PARAS, DPM  Source Information  Regal, Prentice PARAS, DPM  [facility]    Labs on Admission: I have personally reviewed following labs and imaging studies  CBC: Recent Labs  Lab 07/03/24 1026  WBC 12.6*  NEUTROABS 10.5*  HGB 12.2  HCT 38.5  MCV 77.0*  PLT 390   Basic Metabolic Panel: Recent Labs  Lab 07/03/24 1026  NA 130*  K 4.0  CL 93*  CO2 23  GLUCOSE 441*  BUN 10  CREATININE 0.80  CALCIUM 9.3   GFR: Estimated Creatinine Clearance: 106.5 mL/min (by C-G formula based on SCr of 0.8 mg/dL). Liver Function Tests: Recent Labs  Lab 07/03/24 1026  AST 29  ALT 38  ALKPHOS 133*  BILITOT 0.4  PROT 8.1  ALBUMIN 3.4*   No results for input(s): LIPASE, AMYLASE in the last 168 hours. No results for input(s): AMMONIA in the last 168 hours. Coagulation Profile: No results for input(s): INR, PROTIME in the last 168 hours. Cardiac Enzymes: No results for input(s): CKTOTAL, CKMB, CKMBINDEX, TROPONINI, TROPONINIHS in the last 168 hours. BNP (last 3 results) No results for input(s): BNP in the last 8760 hours. HbA1C: No results for input(s): HGBA1C in the last 72 hours. CBG: No results for input(s): GLUCAP in the last 168 hours. Lipid Profile: No results for input(s): CHOL, HDL, LDLCALC, TRIG,  CHOLHDL, LDLDIRECT in the last 72 hours. Thyroid Function Tests: No results for input(s): TSH, T4TOTAL, FREET4, T3FREE, THYROIDAB in the last 72 hours. Anemia Panel: No results for input(s): VITAMINB12, FOLATE, FERRITIN, TIBC, IRON, RETICCTPCT in the last 72 hours. Urine analysis:    Component Value Date/Time   COLORURINE AMBER BIOCHEMICALS MAY BE AFFECTED BY COLOR (A) 08/21/2007 0500   APPEARANCEUR CLOUDY (A) 08/21/2007 0500   LABSPEC 1.028 08/21/2007 0500   PHURINE 6.0 08/21/2007 0500   GLUCOSEU NEGATIVE 08/21/2007 0500   HGBUR NEGATIVE 08/21/2007 0500   BILIRUBINUR NEGATIVE 08/21/2007 0500   KETONESUR >80 (A) 08/21/2007 0500   PROTEINUR 100 (A) 08/21/2007 0500   UROBILINOGEN 0.2 08/21/2007 0500   NITRITE NEGATIVE 08/21/2007 0500   LEUKOCYTESUR MODERATE (A) 08/21/2007 0500    Radiological Exams on Admission: I have personally reviewed images CT ANKLE LEFT W CONTRAST Result Date: 07/03/2024 EXAM: CT LEFT ANKLE, WITH IV CONTRAST 07/03/2024 06:37:29 PM TECHNIQUE: Axial images were acquired through the left ankle with IV contrast. Reformatted images were reviewed. Automated exposure control, iterative reconstruction, and/or weight based adjustment of the mA/kV was utilized to reduce the radiation dose to as low as reasonably achievable. COMPARISON: Radiographs 07/03/2024. CLINICAL HISTORY: Painful wound infection. FINDINGS: BONES: No acute fracture or focal osseous lesion. No bony destructive findings in the ankle characteristic of osteomyelitis. Plantar calcaneal spur and incidental os trigonum noted. No malalignment. JOINTS: No dislocation. The joint spaces are normal. SOFT TISSUES: Mild distal Achilles tendinopathy. Medial band of the plantar fascia proximally may reflect plantar fasciitis. Relatively atrophic proximal abductor digiti minimi muscle raising suspicion for Baxter neuropathy. Mild circumferential subcutaneous edema at the ankle, and tracking into the  dorsum of the foot and along the plantar heel. Cellulitis is not excluded but I don't observe a drainable abscess. No abnormal gas tracking in the soft tissues.  IMPRESSION: 1. No CT evidence of osteomyelitis, drainable abscess, or soft tissue gas. 2. Mild circumferential subcutaneous edema at the ankle extending into the dorsum of the foot and along the plantar heel, which may reflect cellulitis. 3. Mild distal Achilles tendinopathy and suspected proximal plantar fasciitis involving the medial band of the plantar fascia. 4. Relative atrophy of the proximal abductor digiti minimi muscle, suspicious for Baxter neuropathy. 5. Plantar calcaneal spur. Electronically signed by: Ryan Salvage MD 07/03/2024 06:58 PM EST RP Workstation: [id number]   CT FOOT LEFT W CONTRAST Result Date: 07/03/2024 EXAM: CT LEFT FOOT, WITH IV CONTRAST 07/03/2024 06:37:29 PM TECHNIQUE: Axial images were acquired through the left foot with IV contrast. Reformatted images were reviewed. Automated exposure control, iterative reconstruction, and/or weight based adjustment of the mA/kV was utilized to reduce the radiation dose to as low as reasonably achievable. COMPARISON: Radiographs 07/03/2024. CLINICAL HISTORY: Osteomyelitis, foot. FINDINGS: BONES AND JOINTS: No acute fracture or focal osseous lesion. No dislocation. The joint spaces are normal. No definite bony or destructive findings indicative of osteomyelitis. No mild alignment. SOFT TISSUES: Motion artifact is present, reducing diagnostic sensitivity and specificity. Soft tissue swelling and subcutaneous edema in the small toe and tracking back along the soft tissues plantar lateral to the 5th metatarsophalangeal joint. There is some cutaneous irregularity which may be from ulceration, as well as adjacent bandaging. Dorsal soft tissue swelling in the forefoot tracking into the toes. Cellulitis is not excluded. No drainable abscess is identified. No gas tracking in the soft tissues of  the small toe. IMPRESSION: 1. No CT evidence of osteomyelitis or acute osseous abnormality. 2. Soft tissue swelling and subcutaneous edema of the small toe and plantar lateral to the fifth metatarsophalangeal joint with possible ulceration and adjacent bandaging, without soft tissue gas or drainable abscess; cellulitis is not excluded. Dorsal subcutaneous edema along the forefoot. 3. Motion artifact, reducing diagnostic sensitivity and specificity. Electronically signed by: Ryan Salvage MD 07/03/2024 06:54 PM EST RP Workstation: [id number]      Assessment/Plan: Principal Problem:   Sepsis (HCC) Active Problems:   Cellulitis of left foot   Non-insulin  dependent type 2 diabetes mellitus (HCC)   Left foot infection    Assessment and Plan: Sepsis secondary to left foot cellulitis -Patient presented emergency department referred from podiatry clinic for evaluation of left-sided foot wound infection and cellulitis.  At the baseline patient has uncontrolled DM which is also delaying the wound healing of the left foot. Patient reported that she had left stepped on repeatedly for last few days that cause increasing redness swelling and pain of the left lower extremity. -At presentation to ED patient is tachycardic, tachypneic borderline hypertensive.  Afebrile.  CBC showing leukocytosis.  Physical exam showed right foot infection. - Patient meets sepsis criteria. - CT scan of the left ankle foot no evidence of osteomyelitis/drainable abscess or soft tissue gas formation.  It showed circumferential edema concern for cellulitis. -Patient being evaluated by podiatry Dr. Gershon and possibility for I&D tomorrow.  Patient will be n.p.o. after midnight. - Pending blood culture, CRP and ESR. - Recent setting with total 2.5 L of LR bolus, continue maintenance fluid at 125 cc/h. - Continue IV vancomycin  and Zosyn . - Need to follow-up with blood cultures result.  History of non-insulin -dependent DM type  II Hyperglycemic crisis Pseudohyponatremia-secondary to hyperglycemia -CMP showing elevated blood glucose 441 and corrected sodium 135. - Hyperglycemic crisis.  Patient has history of DM type II takes metformin  500 mg twice daily.  A1c 8.5  in 01/2024.  Noncompliant with diabetic diet. - In the ED patient is a setting with total 2.5 L LR bolus hoping that it will decrease blood glucose level. - Adding sliding scale insulin  with bedtime and mealtime coverage. - Continue diabetic and carb modified diet.  N.p.o. after midnight. - Checking A1c level.  Based on the A1c level will decide to add long-acting insulin  for long-term management.   DVT prophylaxis:  Lovenox  Code Status:  Full Code Diet: Heart healthy carb modified Family Communication:   Family was present at bedside, at the time of interview. Opportunity was given to ask question and all questions were answered satisfactorily.  Disposition Plan: Need to follow-up with blood cultures result for antibiotic guidance.  Patient probably undergo I&D tomorrow.  N.p.o. after midnight Consults:  Podiatry Admission status:   Inpatient, Telemetry bed  Severity of Illness: The appropriate patient status for this patient is INPATIENT. Inpatient status is judged to be reasonable and necessary in order to provide the required intensity of service to ensure the patient's safety. The patient's presenting symptoms, physical exam findings, and initial radiographic and laboratory data in the context of their chronic comorbidities is felt to place them at high risk for further clinical deterioration. Furthermore, it is not anticipated that the patient will be medically stable for discharge from the hospital within 2 midnights of admission.   * I certify that at the point of admission it is my clinical judgment that the patient will require inpatient hospital care spanning beyond 2 midnights from the point of admission due to high intensity of service, high risk  for further deterioration and high frequency of surveillance required.DEWAINE    Violetta Lavalle, MD [facility]  How to contact the [facility] Attending or Consulting provider 7A - 7P or covering provider during after hours 7P -7A, for this patient.  Check the care team in [facility] and look for a) attending/consulting [facility] provider listed and b) the [facility] team listed Log into [email or web address] and use [place]'s universal password to access. If you do not have the password, please contact the hospital operator. Locate the [facility] provider you are looking for under [facility] and page to a number that you can be directly reached. If you still have difficulty reaching the provider, please page the [facility] (Director on Call) for the Hospitalists listed on amion for assistance.  07/03/2024, 8:33 PM           [1]  Allergies Allergen Reactions   Other Anaphylaxis and Itching    epidural   Latex Other (See Comments) and Swelling    Other reaction(s): Generalized Edema (intolerance)   Pollen Extract Other (See Comments)    Red eyes, itchy throat   Sulfa Antibiotics Other (See Comments)    Unknown   "
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
## 2024-07-03 NOTE — Progress Notes (Signed)
 Pt being followed by [facility] for Sepsis protocol.

## 2024-07-04 ENCOUNTER — Encounter (HOSPITAL_COMMUNITY): Payer: Self-pay | Admitting: Internal Medicine

## 2024-07-04 ENCOUNTER — Inpatient Hospital Stay (HOSPITAL_COMMUNITY): Payer: Self-pay

## 2024-07-04 ENCOUNTER — Inpatient Hospital Stay (HOSPITAL_COMMUNITY): Payer: Self-pay | Admitting: Registered Nurse

## 2024-07-04 ENCOUNTER — Encounter (HOSPITAL_COMMUNITY): Admission: EM | Payer: Self-pay | Source: Home / Self Care | Attending: Internal Medicine

## 2024-07-04 DIAGNOSIS — L97523 Non-pressure chronic ulcer of other part of left foot with necrosis of muscle: Secondary | ICD-10-CM

## 2024-07-04 DIAGNOSIS — E1142 Type 2 diabetes mellitus with diabetic polyneuropathy: Secondary | ICD-10-CM | POA: Diagnosis not present

## 2024-07-04 DIAGNOSIS — L02612 Cutaneous abscess of left foot: Secondary | ICD-10-CM

## 2024-07-04 DIAGNOSIS — M009 Pyogenic arthritis, unspecified: Secondary | ICD-10-CM

## 2024-07-04 DIAGNOSIS — L03116 Cellulitis of left lower limb: Secondary | ICD-10-CM | POA: Diagnosis not present

## 2024-07-04 LAB — CBC
HCT: 30.3 % — ABNORMAL LOW (ref 36.0–46.0)
Hemoglobin: 9.7 g/dL — ABNORMAL LOW (ref 12.0–15.0)
MCH: 24.3 pg — ABNORMAL LOW (ref 26.0–34.0)
MCHC: 32 g/dL (ref 30.0–36.0)
MCV: 75.8 fL — ABNORMAL LOW (ref 80.0–100.0)
Platelets: 282 10*3/uL (ref 150–400)
RBC: 4 MIL/uL (ref 3.87–5.11)
RDW: 16.1 % — ABNORMAL HIGH (ref 11.5–15.5)
WBC: 7.9 10*3/uL (ref 4.0–10.5)
nRBC: 0 % (ref 0.0–0.2)

## 2024-07-04 LAB — GLUCOSE, CAPILLARY
Glucose-Capillary: 313 mg/dL — ABNORMAL HIGH (ref 70–99)
Glucose-Capillary: 319 mg/dL — ABNORMAL HIGH (ref 70–99)
Glucose-Capillary: 308 mg/dL — ABNORMAL HIGH (ref 70–99)
Glucose-Capillary: 250 mg/dL — ABNORMAL HIGH (ref 70–99)
Glucose-Capillary: 217 mg/dL — ABNORMAL HIGH (ref 70–99)
Glucose-Capillary: 206 mg/dL — ABNORMAL HIGH (ref 70–99)
Glucose-Capillary: 219 mg/dL — ABNORMAL HIGH (ref 70–99)

## 2024-07-04 LAB — TSH: TSH: 1.45 u[IU]/mL (ref 0.350–4.500)

## 2024-07-04 LAB — T4, FREE: Free T4: 1.55 ng/dL (ref 0.80–2.00)

## 2024-07-04 LAB — COMPREHENSIVE METABOLIC PANEL WITH GFR
ALT: 53 U/L — ABNORMAL HIGH (ref 0–44)
AST: 42 U/L — ABNORMAL HIGH (ref 15–41)
Albumin: 2.5 g/dL — ABNORMAL LOW (ref 3.5–5.0)
Alkaline Phosphatase: 100 U/L (ref 38–126)
Anion gap: 8 (ref 5–15)
BUN: 8 mg/dL (ref 6–20)
CO2: 24 mmol/L (ref 22–32)
Calcium: 8.4 mg/dL — ABNORMAL LOW (ref 8.9–10.3)
Chloride: 101 mmol/L (ref 98–111)
Creatinine, Ser: 0.66 mg/dL (ref 0.44–1.00)
GFR, Estimated: >60 mL/min
Glucose, Bld: 244 mg/dL — ABNORMAL HIGH (ref 70–99)
Potassium: 3.9 mmol/L (ref 3.5–5.1)
Sodium: 132 mmol/L — ABNORMAL LOW (ref 135–145)
Total Bilirubin: 0.3 mg/dL (ref 0.0–1.2)
Total Protein: 6.5 g/dL (ref 6.5–8.1)

## 2024-07-04 LAB — URINE DRUG SCREEN
Amphetamines: NEGATIVE
Barbiturates: NEGATIVE
Benzodiazepines: NEGATIVE
Cocaine: NEGATIVE
Fentanyl: NEGATIVE
Methadone Scn, Ur: NEGATIVE
Opiates: POSITIVE — AB
Tetrahydrocannabinol: NEGATIVE

## 2024-07-04 LAB — ETHANOL: Alcohol, Ethyl (B): <15 mg/dL

## 2024-07-04 LAB — HIV ANTIBODY (ROUTINE TESTING W REFLEX): HIV Screen 4th Generation wRfx: NONREACTIVE

## 2024-07-04 LAB — HEMOGLOBIN A1C
Hgb A1c MFr Bld: 11.6 % — ABNORMAL HIGH (ref 4.8–5.6)
Mean Plasma Glucose: 286.22 mg/dL

## 2024-07-04 MED ORDER — PROPOFOL 10 MG/ML IV BOLUS
INTRAVENOUS | Status: DC | PRN
  Administered 2024-07-04 (×2): 50 mg via INTRAVENOUS
  Administered 2024-07-04: 200 mg via INTRAVENOUS

## 2024-07-04 MED ORDER — FENTANYL CITRATE (PF) 100 MCG/2ML IJ SOLN
INTRAMUSCULAR | Status: AC
  Filled 2024-07-04: qty 2

## 2024-07-04 MED ORDER — BUPIVACAINE HCL (PF) 0.5 % IJ SOLN
INTRAMUSCULAR | Status: AC
  Filled 2024-07-04: qty 30

## 2024-07-04 MED ORDER — MIDAZOLAM HCL 2 MG/2ML IJ SOLN
INTRAMUSCULAR | Status: AC
  Filled 2024-07-04: qty 2

## 2024-07-04 MED ORDER — METOPROLOL TARTRATE 5 MG/5ML IV SOLN
2.5000 mg | Freq: Four times a day (QID) | INTRAVENOUS | Status: AC | PRN
  Administered 2024-07-04 (×2): 2.5 mg via INTRAVENOUS
  Filled 2024-07-04 (×2): qty 5

## 2024-07-04 MED ORDER — ONDANSETRON HCL 4 MG/2ML IJ SOLN
INTRAMUSCULAR | Status: DC | PRN
  Administered 2024-07-04: 4 mg via INTRAVENOUS

## 2024-07-04 MED ORDER — LIVING WELL WITH DIABETES BOOK
Freq: Once | Status: AC
  Filled 2024-07-04: qty 1

## 2024-07-04 MED ORDER — INSULIN ASPART 100 UNIT/ML IJ SOLN
INTRAMUSCULAR | Status: AC
  Filled 2024-07-04: qty 8

## 2024-07-04 MED ORDER — INSULIN STARTER KIT- PEN NEEDLES (ENGLISH)
1.0000 | Freq: Once | Status: AC
  Filled 2024-07-04: qty 1

## 2024-07-04 MED ORDER — LIDOCAINE HCL 1 % IJ SOLN
INTRAMUSCULAR | Status: AC
  Filled 2024-07-04: qty 20

## 2024-07-04 MED ORDER — INSULIN ASPART 100 UNIT/ML IJ SOLN
0.0000 [IU] | INTRAMUSCULAR | Status: DC | PRN
  Administered 2024-07-04: 8 [IU] via SUBCUTANEOUS

## 2024-07-04 MED ORDER — OXYCODONE HCL 5 MG PO TABS
ORAL_TABLET | ORAL | Status: AC
  Filled 2024-07-04: qty 1

## 2024-07-04 MED ORDER — FENTANYL CITRATE (PF) 100 MCG/2ML IJ SOLN
25.0000 ug | INTRAMUSCULAR | Status: DC | PRN
  Administered 2024-07-04: 50 ug via INTRAVENOUS

## 2024-07-04 MED ORDER — DEXMEDETOMIDINE HCL IN NACL 80 MCG/20ML IV SOLN
INTRAVENOUS | Status: AC
  Filled 2024-07-04: qty 20

## 2024-07-04 MED ORDER — INSULIN ASPART 100 UNIT/ML IJ SOLN
3.0000 [IU] | Freq: Once | INTRAMUSCULAR | Status: AC
  Administered 2024-07-04: 3 [IU] via SUBCUTANEOUS
  Filled 2024-07-04: qty 3

## 2024-07-04 MED ORDER — PHENYLEPHRINE 80 MCG/ML (10ML) SYRINGE FOR IV PUSH (FOR BLOOD PRESSURE SUPPORT)
PREFILLED_SYRINGE | INTRAVENOUS | Status: DC | PRN
  Administered 2024-07-04: 80 ug via INTRAVENOUS
  Administered 2024-07-04: 160 ug via INTRAVENOUS
  Administered 2024-07-04: 80 ug via INTRAVENOUS

## 2024-07-04 MED ORDER — FENTANYL CITRATE (PF) 250 MCG/5ML IJ SOLN
INTRAMUSCULAR | Status: DC | PRN
  Administered 2024-07-04 (×4): 25 ug via INTRAVENOUS

## 2024-07-04 MED ORDER — PROPOFOL 10 MG/ML IV BOLUS
INTRAVENOUS | Status: AC
  Filled 2024-07-04: qty 20

## 2024-07-04 MED ORDER — ORAL CARE MOUTH RINSE: 15.0000 mL | Freq: Once | OROMUCOSAL | Status: AC

## 2024-07-04 MED ORDER — INSULIN GLARGINE-YFGN 100 UNIT/ML ~~LOC~~ SOLN
18.0000 [IU] | Freq: Every day | SUBCUTANEOUS | Status: DC
  Administered 2024-07-04 – 2024-07-05 (×2): 18 [IU] via SUBCUTANEOUS
  Filled 2024-07-04 (×2): qty 0.18

## 2024-07-04 MED ORDER — OXYCODONE HCL 5 MG PO TABS
5.0000 mg | ORAL_TABLET | Freq: Once | ORAL | Status: AC | PRN
  Administered 2024-07-04: 5 mg via ORAL

## 2024-07-04 MED ORDER — METOPROLOL TARTRATE 5 MG/5ML IV SOLN: 2.5000 mg | INTRAVENOUS | Status: DC | PRN

## 2024-07-04 MED ORDER — MIDAZOLAM HCL (PF) 2 MG/2ML IJ SOLN
INTRAMUSCULAR | Status: DC | PRN
  Administered 2024-07-04: 2 mg via INTRAVENOUS

## 2024-07-04 MED ORDER — LIDOCAINE 2% (20 MG/ML) 5 ML SYRINGE
INTRAMUSCULAR | Status: AC
  Filled 2024-07-04: qty 5

## 2024-07-04 MED ORDER — ACETAMINOPHEN 10 MG/ML IV SOLN: 1000.0000 mg | Freq: Once | INTRAVENOUS | Status: DC | PRN

## 2024-07-04 MED ORDER — LACTATED RINGERS IV SOLN: INTRAVENOUS | Status: DC

## 2024-07-04 MED ORDER — SODIUM CHLORIDE 0.9 % IR SOLN
Status: DC | PRN
  Administered 2024-07-04: 3000 mL

## 2024-07-04 MED ORDER — OXYCODONE HCL 5 MG PO TABS
5.0000 mg | ORAL_TABLET | ORAL | Status: AC | PRN
  Administered 2024-07-04 – 2024-07-10 (×12): 5 mg via ORAL
  Filled 2024-07-04 (×12): qty 1

## 2024-07-04 MED ORDER — CHLORHEXIDINE GLUCONATE 0.12 % MT SOLN
15.0000 mL | Freq: Once | OROMUCOSAL | Status: AC
  Administered 2024-07-04: 15 mL via OROMUCOSAL

## 2024-07-04 MED ORDER — OXYCODONE HCL 5 MG/5ML PO SOLN: 5.0000 mg | Freq: Once | ORAL | Status: AC | PRN

## 2024-07-04 MED ORDER — DEXAMETHASONE SOD PHOSPHATE PF 10 MG/ML IJ SOLN
INTRAMUSCULAR | Status: AC
  Filled 2024-07-04: qty 1

## 2024-07-04 MED ORDER — ONDANSETRON HCL 4 MG/2ML IJ SOLN
INTRAMUSCULAR | Status: AC
  Filled 2024-07-04: qty 2

## 2024-07-04 MED ORDER — DROPERIDOL 2.5 MG/ML IJ SOLN: 0.6250 mg | Freq: Once | INTRAMUSCULAR | Status: DC | PRN

## 2024-07-04 MED ORDER — DEXMEDETOMIDINE HCL IN NACL 80 MCG/20ML IV SOLN
INTRAVENOUS | Status: DC | PRN
  Administered 2024-07-04 (×3): 4 ug via INTRAVENOUS

## 2024-07-04 MED ORDER — 0.9 % SODIUM CHLORIDE (POUR BTL) OPTIME
TOPICAL | Status: DC | PRN
  Administered 2024-07-04: 1000 mL
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
## 2024-07-04 NOTE — Inpatient Diabetes Management (Signed)
 Inpatient Diabetes Program Recommendations  AACE/ADA: New Consensus Statement on Inpatient Glycemic Control (2015)  Target Ranges:  Prepandial:   less than 140 mg/dL      Peak postprandial:   less than 180 mg/dL (1-2 hours)      Critically ill patients:  140 - 180 mg/dL   Lab Results  Component Value Date   GLUCAP 250 (H) 07/04/2024   HGBA1C 11.6 (H) 07/03/2024    Review of Glycemic Control  Latest Reference Range & Units 07/04/24 07:21 07/04/24 09:05  Glucose-Capillary 70 - 99 mg/dL 691 (H) 749 (H)  (H): Data is abnormally high Diabetes history: Type 2 DM Outpatient Diabetes medications: Metformin  500 mg BID Current orders for Inpatient glycemic control: Novolog  0-9 units TID & HS   Inpatient Diabetes Program Recommendations:    Consider adding Lantus  18 units every day.  Will order insulin  starter kit, dietitian consult and LWWDM. Will have DM team see when appropriate.   Thanks, Tinnie Minus, MSN, RNC-OB Diabetes Coordinator [phone] (8a-5p)

## 2024-07-04 NOTE — Transfer of Care (Signed)
 Immediate Anesthesia Transfer of Care Note  Patient: Patricia Burton  Procedure(s) Performed: INCISION AND DRAINAGE WITH DEBRIEDMENT OF LEFT FOOT (Left: Foot)  Patient Location: PACU  Anesthesia Type:General  Level of Consciousness: drowsy and patient cooperative  Airway & Oxygen Therapy: Patient Spontanous Breathing  Post-op Assessment: Report given to RN and Post -op Vital signs reviewed and stable  Post vital signs: Reviewed and stable  Last Vitals:  Vitals Value Taken Time  BP 121/86 07/04/24 09:04  Temp    Pulse 95 0904  Resp 12 0904  SpO2 100 0904  Vitals shown include unfiled device data.  Last Pain:  Vitals:   07/04/24 0702  TempSrc: Oral  PainSc: 0-No pain         Complications: There were no known notable events for this encounter.

## 2024-07-04 NOTE — Anesthesia Preprocedure Evaluation (Signed)
"                                    Anesthesia Evaluation  Patient identified by MRN, date of birth, ID band Patient awake    Reviewed: Allergy & Precautions, H&P , NPO status , Patient's Chart, lab work & pertinent test results  History of Anesthesia Complications Negative for: history of anesthetic complications  Airway Mallampati: III  TM Distance: >3 FB Neck ROM: Full    Dental no notable dental hx.    Pulmonary neg pulmonary ROS   Pulmonary exam normal breath sounds clear to auscultation       Cardiovascular negative cardio ROS Normal cardiovascular exam Rhythm:Regular Rate:Normal     Neuro/Psych negative neurological ROS  negative psych ROS   GI/Hepatic negative GI ROS, Neg liver ROS,,,  Endo/Other  diabetes, Poorly Controlled, Type 2    Renal/GU negative Renal ROS  negative genitourinary   Musculoskeletal Cellulitis of left foot   Abdominal   Peds negative pediatric ROS (+)  Hematology negative hematology ROS (+)   Anesthesia Other Findings A1c 11.6  Reproductive/Obstetrics negative OB ROS                              Anesthesia Physical Anesthesia Plan  ASA: 2  Anesthesia Plan: General   Post-op Pain Management: Tylenol  PO (pre-op)*   Induction: Intravenous  PONV Risk Score and Plan: 3 and Ondansetron , Dexamethasone , Midazolam  and Treatment may vary due to age or medical condition  Airway Management Planned: LMA  Additional Equipment: None  Intra-op Plan:   Post-operative Plan: Extubation in OR  Informed Consent: I have reviewed the patients History and Physical, chart, labs and discussed the procedure including the risks, benefits and alternatives for the proposed anesthesia with the patient or authorized representative who has indicated his/her understanding and acceptance.     Dental advisory given  Plan Discussed with: CRNA  Anesthesia Plan Comments:          Anesthesia Quick  Evaluation  "
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
## 2024-07-04 NOTE — Brief Op Note (Signed)
 07/04/2024  7:45 AM  8:57 AM  PATIENT:  Patricia Burton  44 y.o. female  PRE-OPERATIVE DIAGNOSIS:  INFECTION LEFT FOOT  POST-OPERATIVE DIAGNOSIS:  INFECTION LEFT FOOT  PROCEDURE:  Procedures: INCISION AND DRAINAGE WITH DEBRIEDMENT OF LEFT FOOT (Left)  SURGEON:  Surgeons and Role:    * Gershon Donnice SAUNDERS, DPM - Primary  PHYSICIAN ASSISTANT:   ASSISTANTS: none   ANESTHESIA:   general  EBL:  50 mL   BLOOD ADMINISTERED:none  DRAINS: none   LOCAL MEDICATIONS USED:  NONE  SPECIMEN:  Source of Specimen:  wound culture  DISPOSITION OF SPECIMEN:  PATHOLOGY  COUNTS:  YES  TOURNIQUET:  * No tourniquets in log *  DICTATION: .Dragon Dictation  PLAN OF CARE: Admit to inpatient   PATIENT DISPOSITION:  PACU - hemodynamically stable.   Delay start of Pharmacological VTE agent (>24hrs) due to surgical blood loss or risk of bleeding: no  Intraoperative findings: Extensive purulence noted along the dorsal, lateral aspect of foot going to the second MTPJ bilaterally.  Debrided infected, nonviable tissue to the dorsal lateral aspect left foot.  Post wound measurements were 9 x 3 x 1 cm down to the level of the tendon, fascia.  There is no tracking into the ankle.  Small incisions were made along the dorsal aspect of the foot on the first interspace as well as the second interspace.  No purulence on the first interspace.  Wound culture was obtained of the purulence today.  Debrided, irrigated the wound.  Hemostasis achieved.  Will need to return to the OR given the extent of the infection on Monday for washout, debridement and likely fifth ray amputation and graft application pending infection.  Continue broad-spectrum antibiotics.  Elevation.  Nonweightbearing.

## 2024-07-04 NOTE — Progress Notes (Signed)
 Orthopedic Tech Progress Note Patient Details:  STEPHENY CANAL 01/28/1981 [id number]  Ortho Devices Type of Ortho Device: Postop shoe/boot Ortho Device/Splint Location: LLE/Left with PACU nurse Ortho Device/Splint Interventions: Ordered   Post Interventions Patient Tolerated: Well  Adine MARLA Blush 07/04/2024, 9:32 AM

## 2024-07-04 NOTE — Anesthesia Postprocedure Evaluation (Signed)
"   Anesthesia Post Note  Patient: Alberta MALVA Daring  Procedure(s) Performed: INCISION AND DRAINAGE WITH DEBRIEDMENT OF LEFT FOOT (Left: Foot)     Patient location during evaluation: PACU Anesthesia Type: General Level of consciousness: awake and alert Pain management: pain level controlled Vital Signs Assessment: post-procedure vital signs reviewed and stable Respiratory status: spontaneous breathing, nonlabored ventilation, respiratory function stable and patient connected to nasal cannula oxygen Cardiovascular status: blood pressure returned to baseline and stable Postop Assessment: no apparent nausea or vomiting Anesthetic complications: no   There were no known notable events for this encounter.  Last Vitals:  Vitals:   07/04/24 1138 07/04/24 1556  BP: (!) 142/89 (!) 169/94  Pulse: 94 (!) 120  Resp: 16 20  Temp: 36.7 C (!) 39.3 C  SpO2: 95% 97%    Last Pain:  Vitals:   07/04/24 1613  TempSrc:   PainSc: 6                  Thom JONELLE Peoples      "

## 2024-07-04 NOTE — Anesthesia Procedure Notes (Addendum)
 Procedure Name: LMA Insertion Date/Time: 07/04/2024 8:06 AM  Performed by: Christopher Comings, CRNAPre-anesthesia Checklist: Patient identified, Emergency Drugs available, Suction available and Patient being monitored Patient Re-evaluated:Patient Re-evaluated prior to induction Oxygen Delivery Method: Circle system utilized Preoxygenation: Pre-oxygenation with 100% oxygen Induction Type: IV induction Ventilation: Mask ventilation without difficulty LMA: LMA inserted LMA Size: 4.0 Number of attempts: 2 Placement Confirmation: positive ETCO2 and breath sounds checked- equal and bilateral Tube secured with: Tape Dental Injury: Teeth and Oropharynx as per pre-operative assessment

## 2024-07-05 ENCOUNTER — Inpatient Hospital Stay (HOSPITAL_COMMUNITY): Payer: Self-pay

## 2024-07-05 ENCOUNTER — Encounter (HOSPITAL_COMMUNITY): Payer: Self-pay | Admitting: Podiatry

## 2024-07-05 DIAGNOSIS — I7090 Unspecified atherosclerosis: Secondary | ICD-10-CM

## 2024-07-05 LAB — BASIC METABOLIC PANEL WITH GFR
Anion gap: 11 (ref 5–15)
BUN: 6 mg/dL (ref 6–20)
CO2: 22 mmol/L (ref 22–32)
Calcium: 8.4 mg/dL — ABNORMAL LOW (ref 8.9–10.3)
Chloride: 100 mmol/L (ref 98–111)
Creatinine, Ser: 0.65 mg/dL (ref 0.44–1.00)
GFR, Estimated: >60 mL/min
Glucose, Bld: 212 mg/dL — ABNORMAL HIGH (ref 70–99)
Potassium: 3.9 mmol/L (ref 3.5–5.1)
Sodium: 133 mmol/L — ABNORMAL LOW (ref 135–145)

## 2024-07-05 LAB — CBC WITH DIFFERENTIAL/PLATELET
Abs Immature Granulocytes: 0.05 10*3/uL (ref 0.00–0.07)
Basophils Absolute: 0 10*3/uL (ref 0.0–0.1)
Basophils Relative: 0 %
Eosinophils Absolute: 0.1 10*3/uL (ref 0.0–0.5)
Eosinophils Relative: 1 %
HCT: 32.2 % — ABNORMAL LOW (ref 36.0–46.0)
Hemoglobin: 10.3 g/dL — ABNORMAL LOW (ref 12.0–15.0)
Immature Granulocytes: 1 %
Lymphocytes Relative: 21 %
Lymphs Abs: 1.7 10*3/uL (ref 0.7–4.0)
MCH: 24.2 pg — ABNORMAL LOW (ref 26.0–34.0)
MCHC: 32 g/dL (ref 30.0–36.0)
MCV: 75.6 fL — ABNORMAL LOW (ref 80.0–100.0)
Monocytes Absolute: 0.7 10*3/uL (ref 0.1–1.0)
Monocytes Relative: 9 %
Neutro Abs: 5.6 10*3/uL (ref 1.7–7.7)
Neutrophils Relative %: 68 %
Platelets: 309 10*3/uL (ref 150–400)
RBC: 4.26 MIL/uL (ref 3.87–5.11)
RDW: 16.4 % — ABNORMAL HIGH (ref 11.5–15.5)
WBC: 8.2 10*3/uL (ref 4.0–10.5)
nRBC: 0 % (ref 0.0–0.2)

## 2024-07-05 LAB — GLUCOSE, CAPILLARY
Glucose-Capillary: 204 mg/dL — ABNORMAL HIGH (ref 70–99)
Glucose-Capillary: 243 mg/dL — ABNORMAL HIGH (ref 70–99)
Glucose-Capillary: 238 mg/dL — ABNORMAL HIGH (ref 70–99)
Glucose-Capillary: 187 mg/dL — ABNORMAL HIGH (ref 70–99)

## 2024-07-05 LAB — VAS US ABI WITH/WO TBI
Left ABI: NORMAL
Right ABI: 1

## 2024-07-05 LAB — PHOSPHORUS: Phosphorus: 2.3 mg/dL — ABNORMAL LOW (ref 2.5–4.6)

## 2024-07-05 LAB — MAGNESIUM: Magnesium: 1.8 mg/dL (ref 1.7–2.4)

## 2024-07-05 MED ORDER — PIPERACILLIN-TAZOBACTAM 3.375 G IVPB
3.3750 g | Freq: Three times a day (TID) | INTRAVENOUS | Status: DC
  Administered 2024-07-05 – 2024-07-07 (×6): 3.375 g via INTRAVENOUS
  Filled 2024-07-05 (×6): qty 50

## 2024-07-05 MED ORDER — INSULIN GLARGINE-YFGN 100 UNIT/ML ~~LOC~~ SOLN
24.0000 [IU] | Freq: Every day | SUBCUTANEOUS | Status: AC
  Administered 2024-07-07 – 2024-07-10 (×4): 24 [IU] via SUBCUTANEOUS
  Filled 2024-07-05 (×6): qty 0.24

## 2024-07-05 MED ORDER — SODIUM CHLORIDE 0.9 % IV SOLN: INTRAVENOUS | Status: AC

## 2024-07-05 NOTE — TOC Initial Note (Signed)
 Transition of Care [facility]) - Initial/Assessment Note    Patient Details  Name: Patricia Burton MRN: [id number] Date of Birth: 10/30/1980  Transition of Care [facility]) CM/SW Contact:    Arlana JINNY Nicholaus ISRAEL Phone Number: [phone] 07/05/2024, 10:14 AM  Clinical Narrative:           CSW attempted to reach patient by phone and left a VM. Will continue to make attempts to reach the patient to complete initial/assessment.   Unit CSW/CM will continue to follow and monitor dc readiness.                Patient Goals and CMS Choice            Expected Discharge Plan and Services                                              Prior Living Arrangements/Services                       Activities of Daily Living   ADL Screening (condition at time of admission) Independently performs ADLs?: No Does the patient have a NEW difficulty with bathing/dressing/toileting/self-feeding that is expected to last >3 days?: No Does the patient have a NEW difficulty with getting in/out of bed, walking, or climbing stairs that is expected to last >3 days?: No Does the patient have a NEW difficulty with communication that is expected to last >3 days?: No Is the patient deaf or have difficulty hearing?: No Does the patient have difficulty seeing, even when wearing glasses/contacts?: No Does the patient have difficulty concentrating, remembering, or making decisions?: No  Permission Sought/Granted                  Emotional Assessment              Admission diagnosis:  Cellulitis of left lower extremity [L03.116] Sepsis (HCC) [A41.9] Patient Active Problem List   Diagnosis Date Noted   Abscess of left foot 07/04/2024   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) 07/04/2024   Type 2 diabetes mellitus with diabetic polyneuropathy, without long-term current use of insulin  (HCC) 07/04/2024   Sepsis (HCC) 07/03/2024   Left foot infection 07/03/2024   Cellulitis of  left lower extremity 07/03/2024   Seasonal allergic rhinitis 01/07/2024   Plantar fasciitis 01/07/2024   Non-insulin  dependent type 2 diabetes mellitus (HCC) 01/07/2024   PCP:  Thedora Garnette HERO, MD Pharmacy:   [id number] - [place], [place] - [address] AT [facility] OF [address] [place] [address] Phone: [phone] Fax: [phone]  [id number] [place], [place] - [address] AT [address] [place] [address] Phone: [phone] Fax: [phone]     Social Drivers of Health (SDOH) Social History: SDOH Screenings   Food Insecurity: No Food Insecurity (07/03/2024)  Housing: Low Risk (07/03/2024)  Transportation Needs: No Transportation Needs (07/03/2024)  Utilities: Not At Risk (07/03/2024)  Depression (PHQ2-9): Low Risk (01/07/2024)  Tobacco Use: Low Risk (07/04/2024)   SDOH Interventions:     Readmission Risk Interventions     No data to display

## 2024-07-05 NOTE — Progress Notes (Signed)
 VASCULAR LAB    ABI has been performed.  See CV proc for preliminary results.   Johnn Krasowski, RVT 07/05/2024, 10:57 AM

## 2024-07-05 NOTE — Progress Notes (Signed)
 " PROGRESS NOTE    Patricia Burton  FMW:[id number] DOB: [birth year] DOA: 07/03/2024 PCP: Thedora Garnette HERO, MD    Brief Narrative:  44 year old with non-insulin -dependent diabetes type 2, chronic pain syndrome who was following up at podiatry clinic for left-sided foot infection preceded by injury on the toes, worsening pain and swelling so sent to ER for admission and surgical intervention.  In the emergency room heart rate up to 141, blood glucose 441.  WC count 12.6.  Lactic acid was normal.  Blood cultures were drawn. CT scan did not show any evidence of bony abnormality but showed soft tissue swelling and possible abscess.  Subjective: Patient seen and examined.   Left foot hurts but better than yesterday. Tmax 102.8.  Temperature curve trending down.  WBC normalized.  Blood sugar still elevated.   Assessment & Plan:   Sepsis secondary to diabetic foot infection, left foot cellulitis and abscess: Blood cultures were drawn.  No growth so far. currently on vancomycin  and Zosyn . Patient underwent surgical resection, debridement.  Surgical cultures collected.   Surgical cultures with gram-positive cocci. Podiatry planning for repeat debridement 2/2.   Continue maintenance IV fluids.  Adequate pain medications. Mobilize with postop shoes.  Nonweightbearing as instructed.  Type 2 diabetes, uncontrolled with hyperglycemia: Presents with blood glucose of 441. At home on metformin  only. A1c is 11.6.  Inadequately treated.  Will start patient on insulin  and continue to titrate to send home with insulin .  Diabetic educator will be consulted.  Dietitian will be consulted.  Pseudohyponatremia: Stable.  Normal sodium if corrected for blood glucose.     DVT prophylaxis: enoxaparin  (LOVENOX ) injection 40 mg Start: 07/03/24 2100 SCDs Start: 07/03/24 2015 Place TED hose Start: 07/03/24 2015   Code Status: Full code Family Communication: None at the bedside Disposition Plan: Status is:  Inpatient Remains inpatient appropriate because: Immediate postop     Consultants:  Podiatry  Procedures:  I&D and debridement  Antimicrobials:  Vancomycin  and Zosyn  1/30---     Objective: Vitals:   07/04/24 1958 07/05/24 0019 07/05/24 0748 07/05/24 0909  BP: (!) 143/82 130/79 (!) 140/86   Pulse: (!) 105 (!) 109 (!) 110   Resp:  19 16   Temp: 99.4 F (37.4 C) 99.7 F (37.6 C) (!) 101 F (38.3 C) 99.6 F (37.6 C)  TempSrc: Oral (P) Oral Oral Oral  SpO2: 98% 98% 98%   Weight:      Height:        Intake/Output Summary (Last 24 hours) at 07/05/2024 1036 Last data filed at 07/04/2024 1715 Gross per 24 hour  Intake 480 ml  Output --  Net 480 ml   Filed Weights   07/03/24 1018  Weight: 90.7 kg    Examination:  General: Comfortable and interactive. Cardiovascular: S1-S2 normal.  Regular rate rhythm.  Slightly tachycardic with fever. Respiratory: Bilateral clear.  No added sounds. Gastrointestinal: Soft.  Nontender.  Bowel sound present. Ext: Left foot on immediate postop dressing.  Dressing removed by podiatry and pictures uploaded in the chart.    Data Reviewed: I have personally reviewed following labs and imaging studies  CBC: Recent Labs  Lab 07/03/24 1026 07/04/24 1042 07/05/24 0410  WBC 12.6* 7.9 8.2  NEUTROABS 10.5*  --  5.6  HGB 12.2 9.7* 10.3*  HCT 38.5 30.3* 32.2*  MCV 77.0* 75.8* 75.6*  PLT 390 282 309   Basic Metabolic Panel: Recent Labs  Lab 07/03/24 1026 07/03/24 2250 07/04/24 1042 07/05/24 0410 07/05/24 0930  NA 130* 128* 132*  --  133*  K 4.0 3.9 3.9  --  3.9  CL 93* 94* 101  --  100  CO2 23 22 24   --  22  GLUCOSE 441* 334* 244*  --  212*  BUN 10 9 8   --  6  CREATININE 0.80 0.73 0.66  --  0.65  CALCIUM 9.3 8.5* 8.4*  --  8.4*  MG  --   --   --  1.8  --   PHOS  --   --   --  2.3*  --    GFR: Estimated Creatinine Clearance: 106.5 mL/min (by C-G formula based on SCr of 0.65 mg/dL). Liver Function Tests: Recent Labs  Lab  07/03/24 1026 07/04/24 1042  AST 29 42*  ALT 38 53*  ALKPHOS 133* 100  BILITOT 0.4 0.3  PROT 8.1 6.5  ALBUMIN 3.4* 2.5*   No results for input(s): LIPASE, AMYLASE in the last 168 hours. No results for input(s): AMMONIA in the last 168 hours. Coagulation Profile: No results for input(s): INR, PROTIME in the last 168 hours. Cardiac Enzymes: No results for input(s): CKTOTAL, CKMB, CKMBINDEX, TROPONINI in the last 168 hours. BNP (last 3 results) No results for input(s): PROBNP in the last 8760 hours. HbA1C: Recent Labs    07/03/24 2250  HGBA1C 11.6*   CBG: Recent Labs  Lab 07/04/24 0905 07/04/24 1137 07/04/24 1541 07/04/24 2248 07/05/24 0755  GLUCAP 250* 217* 206* 219* 204*   Lipid Profile: No results for input(s): CHOL, HDL, LDLCALC, TRIG, CHOLHDL, LDLDIRECT in the last 72 hours. Thyroid Function Tests: Recent Labs    07/04/24 1042  TSH 1.450  FREET4 1.55   Anemia Panel: No results for input(s): VITAMINB12, FOLATE, FERRITIN, TIBC, IRON, RETICCTPCT in the last 72 hours. Sepsis Labs: Recent Labs  Lab 07/03/24 1049 07/03/24 1759  LATICACIDVEN 1.2 1.4    Recent Results (from the past 240 hours)  Blood culture (routine x 2)     Status: None (Preliminary result)   Collection Time: 07/03/24  5:35 PM   Specimen: BLOOD  Result Value Ref Range Status   Specimen Description BLOOD RIGHT ANTECUBITAL  Final   Special Requests   Final    BOTTLES DRAWN AEROBIC AND ANAEROBIC Blood Culture results may not be optimal due to an inadequate volume of blood received in culture bottles   Culture   Final    NO GROWTH 2 DAYS Performed at [facility], [address], [place], [place] [address]    Report Status PENDING  Incomplete  Blood culture (routine x 2)     Status: None (Preliminary result)   Collection Time: 07/03/24  8:16 PM   Specimen: BLOOD  Result Value Ref Range Status   Specimen Description BLOOD LEFT ANTECUBITAL   Final   Special Requests   Final    BOTTLES DRAWN AEROBIC AND ANAEROBIC Blood Culture adequate volume   Culture   Final    NO GROWTH 2 DAYS Performed at [facility], [address] [place], [place] [address]    Report Status PENDING  Incomplete  Aerobic/Anaerobic Culture w Gram Stain (surgical/deep wound)     Status: None (Preliminary result)   Collection Time: 07/04/24  8:41 AM   Specimen: Soft Tissue, Other  Result Value Ref Range Status   Specimen Description TISSUE  Final   Special Requests CULTURES OF LEFT FOOT  Final   Gram Stain   Final    RARE SQUAMOUS  EPITHELIAL CELLS PRESENT WBC PRESENT, PREDOMINANTLY PMN ABUNDANT GRAM POSITIVE COCCI    Culture   Final    CULTURE REINCUBATED FOR BETTER GROWTH Performed at [facility], [address] [place], [place] [address]    Report Status PENDING  Incomplete         Radiology Studies: CT ANKLE LEFT W CONTRAST Result Date: 07/03/2024 EXAM: CT LEFT ANKLE, WITH IV CONTRAST 07/03/2024 06:37:29 PM TECHNIQUE: Axial images were acquired through the left ankle with IV contrast. Reformatted images were reviewed. Automated exposure control, iterative reconstruction, and/or weight based adjustment of the mA/kV was utilized to reduce the radiation dose to as low as reasonably achievable. COMPARISON: Radiographs 07/03/2024. CLINICAL HISTORY: Painful wound infection. FINDINGS: BONES: No acute fracture or focal osseous lesion. No bony destructive findings in the ankle characteristic of osteomyelitis. Plantar calcaneal spur and incidental os trigonum noted. No malalignment. JOINTS: No dislocation. The joint spaces are normal. SOFT TISSUES: Mild distal Achilles tendinopathy. Medial band of the plantar fascia proximally may reflect plantar fasciitis. Relatively atrophic proximal abductor digiti minimi muscle raising suspicion for Baxter neuropathy. Mild circumferential subcutaneous edema at the ankle, and tracking into the dorsum of the  foot and along the plantar heel. Cellulitis is not excluded but I don't observe a drainable abscess. No abnormal gas tracking in the soft tissues. IMPRESSION: 1. No CT evidence of osteomyelitis, drainable abscess, or soft tissue gas. 2. Mild circumferential subcutaneous edema at the ankle extending into the dorsum of the foot and along the plantar heel, which may reflect cellulitis. 3. Mild distal Achilles tendinopathy and suspected proximal plantar fasciitis involving the medial band of the plantar fascia. 4. Relative atrophy of the proximal abductor digiti minimi muscle, suspicious for Baxter neuropathy. 5. Plantar calcaneal spur. Electronically signed by: Ryan Salvage MD 07/03/2024 06:58 PM EST RP Workstation: [id number]   CT FOOT LEFT W CONTRAST Result Date: 07/03/2024 EXAM: CT LEFT FOOT, WITH IV CONTRAST 07/03/2024 06:37:29 PM TECHNIQUE: Axial images were acquired through the left foot with IV contrast. Reformatted images were reviewed. Automated exposure control, iterative reconstruction, and/or weight based adjustment of the mA/kV was utilized to reduce the radiation dose to as low as reasonably achievable. COMPARISON: Radiographs 07/03/2024. CLINICAL HISTORY: Osteomyelitis, foot. FINDINGS: BONES AND JOINTS: No acute fracture or focal osseous lesion. No dislocation. The joint spaces are normal. No definite bony or destructive findings indicative of osteomyelitis. No mild alignment. SOFT TISSUES: Motion artifact is present, reducing diagnostic sensitivity and specificity. Soft tissue swelling and subcutaneous edema in the small toe and tracking back along the soft tissues plantar lateral to the 5th metatarsophalangeal joint. There is some cutaneous irregularity which may be from ulceration, as well as adjacent bandaging. Dorsal soft tissue swelling in the forefoot tracking into the toes. Cellulitis is not excluded. No drainable abscess is identified. No gas tracking in the soft tissues of the small  toe. IMPRESSION: 1. No CT evidence of osteomyelitis or acute osseous abnormality. 2. Soft tissue swelling and subcutaneous edema of the small toe and plantar lateral to the fifth metatarsophalangeal joint with possible ulceration and adjacent bandaging, without soft tissue gas or drainable abscess; cellulitis is not excluded. Dorsal subcutaneous edema along the forefoot. 3. Motion artifact, reducing diagnostic sensitivity and specificity. Electronically signed by: Ryan Salvage MD 07/03/2024 06:54 PM EST RP Workstation: [id number]        Scheduled Meds:  enoxaparin  (LOVENOX ) injection  40 mg Subcutaneous Q24H   insulin  aspart  0-5 Units Subcutaneous QHS   insulin   aspart  0-9 Units Subcutaneous TID WC   insulin  glargine-yfgn  18 Units Subcutaneous Daily   insulin  starter kit- pen needles  1 kit Other Once   living well with diabetes book   Does not apply Once   sodium chloride  flush  3 mL Intravenous Q12H   sodium chloride  flush  3 mL Intravenous Q12H   Continuous Infusions:  sodium chloride  100 mL/hr at 07/05/24 0230   piperacillin -tazobactam (ZOSYN )  IV 3.375 g (07/05/24 0253)   vancomycin  1,250 mg (07/05/24 0910)     LOS: 2 days       Renato Applebaum, MD [facility]   "

## 2024-07-05 NOTE — Progress Notes (Signed)
 "  PODIATRY PROGRESS NOTE  NAME Patricia Burton MRN [id number] DOB November 21, 1980 DOA 07/03/2024   Reason for consult:  Chief Complaint  Patient presents with   Foot Pain   Wound Infection     History of present illness: 44 y.o. female POD # 1 s/p left foot I&D, debridement.  States that she is feeling well.  Current pain level is less than 1/10.  Currently denies any fevers or chills.  Had low-grade fever this morning.  Vitals:   07/05/24 0019 07/05/24 0748  BP: 130/79 (!) 140/86  Pulse: (!) 109 (!) 110  Resp: 19 16  Temp: 99.7 F (37.6 C) (!) 101 F (38.3 C)  SpO2: 98% 98%       Latest Ref Rng & Units 07/05/2024    4:10 AM 07/04/2024   10:42 AM 07/03/2024   10:26 AM  CBC  WBC 4.0 - 10.5 K/uL 8.2  7.9  12.6   Hemoglobin 12.0 - 15.0 g/dL 89.6  9.7  87.7   Hematocrit 36.0 - 46.0 % 32.2  30.3  38.5   Platelets 150 - 400 K/uL 309  282  390        Latest Ref Rng & Units 07/04/2024   10:42 AM 07/03/2024   10:50 PM 07/03/2024   10:26 AM  BMP  Glucose 70 - 99 mg/dL 755  665  558   BUN 6 - 20 mg/dL 8  9  10    Creatinine 0.44 - 1.00 mg/dL 9.33  9.26  9.19   Sodium 135 - 145 mmol/L 132  128  130   Potassium 3.5 - 5.1 mmol/L 3.9  3.9  4.0   Chloride 98 - 111 mmol/L 101  94  93   CO2 22 - 32 mmol/L 24  22  23    Calcium 8.9 - 10.3 mg/dL 8.4  8.5  9.3       Physical Exam: General: AAO x 3, NAD   Dermatology: Full-thickness ulceration noted on the dorsal lateral aspect of the foot with exposed extensor tendon as pictured below.  Decreased edema to the foot as well as decreased cellulitis.  There is no frank purulence noted today.  There is no malodor.  Dusky changes present of the fifth toe.  No fluctuation or crepitation in the ankle.      Vascular:Feet warm and perfused.  Neurological: Decreased  Musculoskeletal Exam: No significant pain on exam.    ASSESSMENT/PLAN OF CARE  - Dressing changed today.  I repacked the wounds.  Dry dressing applied. - Discussed need for  return to the operating room for tomorrow.  Discussed with likelihood of partial fifth ray amputation and wound debridement, I&D and possible synthetic graft application.  We discussed the procedure as well as postoperative course.  She still may need to have serial debridements in the future.  She is in agreement to proceed with surgery tomorrow with Dr. Malvin.   - Continue broad-spectrum antibiotics for now - Nonweightbearing - Culture:  GPC - Pain medication as ordered - Podiatry will continue to follow.     Please contact me directly with any questions or concerns.     Donnice Fees, DPM [facility]  Dr. Donnice SAUNDERS. Patricia Burton, DPM    [address]  [place], [place] [address]                Office [phone]  Fax [phone]        "

## 2024-07-05 NOTE — Plan of Care (Signed)

## 2024-07-06 ENCOUNTER — Encounter (HOSPITAL_COMMUNITY): Payer: Self-pay | Admitting: Anesthesiology

## 2024-07-06 ENCOUNTER — Inpatient Hospital Stay (HOSPITAL_COMMUNITY): Payer: Self-pay

## 2024-07-06 ENCOUNTER — Encounter (HOSPITAL_COMMUNITY): Payer: Self-pay | Admitting: Internal Medicine

## 2024-07-06 ENCOUNTER — Encounter (HOSPITAL_COMMUNITY): Admission: EM | Payer: Self-pay | Source: Home / Self Care | Attending: Internal Medicine

## 2024-07-06 DIAGNOSIS — M869 Osteomyelitis, unspecified: Secondary | ICD-10-CM

## 2024-07-06 DIAGNOSIS — M86172 Other acute osteomyelitis, left ankle and foot: Secondary | ICD-10-CM | POA: Diagnosis not present

## 2024-07-06 DIAGNOSIS — L089 Local infection of the skin and subcutaneous tissue, unspecified: Secondary | ICD-10-CM

## 2024-07-06 DIAGNOSIS — L02612 Cutaneous abscess of left foot: Secondary | ICD-10-CM | POA: Diagnosis not present

## 2024-07-06 LAB — BASIC METABOLIC PANEL WITH GFR
Anion gap: 13 (ref 5–15)
BUN: 6 mg/dL (ref 6–20)
CO2: 23 mmol/L (ref 22–32)
Calcium: 8.1 mg/dL — ABNORMAL LOW (ref 8.9–10.3)
Chloride: 97 mmol/L — ABNORMAL LOW (ref 98–111)
Creatinine, Ser: 0.63 mg/dL (ref 0.44–1.00)
GFR, Estimated: >60 mL/min
Glucose, Bld: 183 mg/dL — ABNORMAL HIGH (ref 70–99)
Potassium: 3.4 mmol/L — ABNORMAL LOW (ref 3.5–5.1)
Sodium: 134 mmol/L — ABNORMAL LOW (ref 135–145)

## 2024-07-06 LAB — GLUCOSE, CAPILLARY
Glucose-Capillary: 167 mg/dL — ABNORMAL HIGH (ref 70–99)
Glucose-Capillary: 129 mg/dL — ABNORMAL HIGH (ref 70–99)
Glucose-Capillary: 135 mg/dL — ABNORMAL HIGH (ref 70–99)
Glucose-Capillary: 280 mg/dL — ABNORMAL HIGH (ref 70–99)

## 2024-07-06 MED ORDER — CHLORHEXIDINE GLUCONATE CLOTH 2 % EX PADS
6.0000 | MEDICATED_PAD | Freq: Once | CUTANEOUS | Status: AC
  Administered 2024-07-06: 6 via TOPICAL

## 2024-07-06 MED ORDER — ONDANSETRON HCL 4 MG/2ML IJ SOLN
INTRAMUSCULAR | Status: DC | PRN
  Administered 2024-07-06: 4 mg via INTRAVENOUS

## 2024-07-06 MED ORDER — LACTATED RINGERS IV SOLN: INTRAVENOUS | Status: DC

## 2024-07-06 MED ORDER — SODIUM CHLORIDE 0.9 % IR SOLN
Status: DC | PRN
  Administered 2024-07-06: 3000 mL

## 2024-07-06 MED ORDER — OXYCODONE HCL 5 MG PO TABS: 5.0000 mg | ORAL_TABLET | Freq: Once | ORAL | Status: DC | PRN

## 2024-07-06 MED ORDER — DEXAMETHASONE SOD PHOSPHATE PF 10 MG/ML IJ SOLN
INTRAMUSCULAR | Status: AC
  Filled 2024-07-06: qty 1

## 2024-07-06 MED ORDER — FENTANYL CITRATE (PF) 250 MCG/5ML IJ SOLN
INTRAMUSCULAR | Status: DC | PRN
  Administered 2024-07-06: 50 ug via INTRAVENOUS

## 2024-07-06 MED ORDER — ONDANSETRON HCL 4 MG/2ML IJ SOLN: 4.0000 mg | Freq: Four times a day (QID) | INTRAMUSCULAR | Status: DC | PRN

## 2024-07-06 MED ORDER — CHLORHEXIDINE GLUCONATE 0.12 % MT SOLN
OROMUCOSAL | Status: AC
  Administered 2024-07-06: 15 mL via OROMUCOSAL
  Filled 2024-07-06: qty 15

## 2024-07-06 MED ORDER — FENTANYL CITRATE (PF) 100 MCG/2ML IJ SOLN: 25.0000 ug | INTRAMUSCULAR | Status: DC | PRN

## 2024-07-06 MED ORDER — LIDOCAINE 2% (20 MG/ML) 5 ML SYRINGE
INTRAMUSCULAR | Status: AC
  Filled 2024-07-06: qty 5

## 2024-07-06 MED ORDER — ORAL CARE MOUTH RINSE: 15.0000 mL | Freq: Once | OROMUCOSAL | Status: AC

## 2024-07-06 MED ORDER — ZINC SULFATE 220 (50 ZN) MG PO CAPS
220.0000 mg | ORAL_CAPSULE | Freq: Every day | ORAL | Status: AC
  Administered 2024-07-06 – 2024-07-10 (×5): 220 mg via ORAL
  Filled 2024-07-06 (×5): qty 1

## 2024-07-06 MED ORDER — PHENYLEPHRINE 80 MCG/ML (10ML) SYRINGE FOR IV PUSH (FOR BLOOD PRESSURE SUPPORT)
PREFILLED_SYRINGE | INTRAVENOUS | Status: AC
  Filled 2024-07-06: qty 10

## 2024-07-06 MED ORDER — LIDOCAINE 2% (20 MG/ML) 5 ML SYRINGE
INTRAMUSCULAR | Status: DC | PRN
  Administered 2024-07-06: 60 mg via INTRAVENOUS

## 2024-07-06 MED ORDER — PROPOFOL 10 MG/ML IV BOLUS
INTRAVENOUS | Status: AC
  Filled 2024-07-06: qty 20

## 2024-07-06 MED ORDER — LIDOCAINE HCL 1 % IJ SOLN
INTRAMUSCULAR | Status: DC | PRN
  Administered 2024-07-06: 20 mL via INTRAMUSCULAR

## 2024-07-06 MED ORDER — FENTANYL CITRATE (PF) 100 MCG/2ML IJ SOLN
INTRAMUSCULAR | Status: AC
  Filled 2024-07-06: qty 2

## 2024-07-06 MED ORDER — ONDANSETRON HCL 4 MG/2ML IJ SOLN
INTRAMUSCULAR | Status: AC
  Filled 2024-07-06: qty 2

## 2024-07-06 MED ORDER — AMISULPRIDE (ANTIEMETIC) 5 MG/2ML IV SOLN
10.0000 mg | Freq: Once | INTRAVENOUS | Status: AC
  Administered 2024-07-06: 10 mg via INTRAVENOUS

## 2024-07-06 MED ORDER — PROSOURCE PLUS PO LIQD
30.0000 mL | Freq: Two times a day (BID) | ORAL | Status: AC
  Administered 2024-07-06 – 2024-07-10 (×5): 30 mL via ORAL
  Filled 2024-07-06 (×8): qty 30

## 2024-07-06 MED ORDER — POTASSIUM CHLORIDE CRYS ER 20 MEQ PO TBCR
20.0000 meq | EXTENDED_RELEASE_TABLET | Freq: Two times a day (BID) | ORAL | Status: AC
  Administered 2024-07-06 – 2024-07-07 (×4): 20 meq via ORAL
  Filled 2024-07-06 (×4): qty 1

## 2024-07-06 MED ORDER — CHLORHEXIDINE GLUCONATE 0.12 % MT SOLN: 15.0000 mL | Freq: Once | OROMUCOSAL | Status: AC

## 2024-07-06 MED ORDER — PHENYLEPHRINE 80 MCG/ML (10ML) SYRINGE FOR IV PUSH (FOR BLOOD PRESSURE SUPPORT)
PREFILLED_SYRINGE | INTRAVENOUS | Status: DC | PRN
  Administered 2024-07-06: 160 ug via INTRAVENOUS
  Administered 2024-07-06: 80 ug via INTRAVENOUS
  Administered 2024-07-06: 160 ug via INTRAVENOUS

## 2024-07-06 MED ORDER — GLUCERNA SHAKE PO LIQD
237.0000 mL | Freq: Two times a day (BID) | ORAL | Status: AC
  Administered 2024-07-06 – 2024-07-10 (×4): 237 mL via ORAL

## 2024-07-06 MED ORDER — OXYCODONE HCL 5 MG/5ML PO SOLN: 5.0000 mg | Freq: Once | ORAL | Status: DC | PRN

## 2024-07-06 MED ORDER — AMISULPRIDE (ANTIEMETIC) 5 MG/2ML IV SOLN
INTRAVENOUS | Status: AC
  Filled 2024-07-06: qty 4

## 2024-07-06 MED ORDER — PROPOFOL 10 MG/ML IV BOLUS
INTRAVENOUS | Status: DC | PRN
  Administered 2024-07-06: 180 mg via INTRAVENOUS

## 2024-07-06 MED ORDER — ADULT MULTIVITAMIN W/MINERALS CH
1.0000 | ORAL_TABLET | Freq: Every day | ORAL | Status: AC
  Administered 2024-07-06 – 2024-07-10 (×5): 1 via ORAL
  Filled 2024-07-06 (×5): qty 1

## 2024-07-06 MED ORDER — VITAMIN C 500 MG PO TABS
500.0000 mg | ORAL_TABLET | Freq: Every day | ORAL | Status: AC
  Administered 2024-07-06 – 2024-07-10 (×5): 500 mg via ORAL
  Filled 2024-07-06 (×5): qty 1

## 2024-07-06 NOTE — TOC Progression Note (Addendum)
 Transition of Care [facility]) - Progression Note    Patient Details  Name: Patricia Burton MRN: [id number] Date of Birth: [birth year]  [facility] CM/SW Contact  Rosaline JONELLE Joe, RN Phone Number: 07/06/2024, 4:41 PM  Clinical Narrative:    CM called and spoke with the patient by phone to discuss IP Care management needs.  The patient lives at home with her father and 46 year old daughter.  Patient has no insurance listed at this time.  Patient states that she is not sure of her insurance provider but plans to obtain a copy of her card from home if possible.  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  DME at the home includes a RW, crutches that are in the home but not used by her.  Patient will likely need wound vac for home - message sent to Dr. Malvin regarding this so I can coordinate wound vac dressing changes in the office - since I will likely be unable to obtain a home health provider due to the payor source.  MD was notified.  Patient will have ID consulted per MD note. Patient may likely need IV antibiotics for home.  I called Holley Herring, RNCM with [employer] and she will follow the patient.  I called admitting at [facility] and asked that they follow up with the patient to obtain insurance provider coverage.  Patient declines WC and states that she will not need this for home - considering the hallways are small and she is unable to navigate through them with a chair.  I spoke with Dr. Malvin and he is aware that I may likely be unable to obtain a home health provider for the patient.  I will follow up with the patient in the am regarding insurance card.  I called Randine Pope, CM with [employer] and information sent to her to start insurance authorization for the patient's wound vac needs.   The plan if for patient to follow up for wound vac changes in the MD office 1 x per week and  other day at wound care center if I am unable to find accepting home health provider.  CM will continue to follow the patient for for home health needs - wound vac and IV antibiotics.                     Expected Discharge Plan and Services                                               Social Drivers of Health (SDOH) Interventions SDOH Screenings   Food Insecurity: No Food Insecurity (07/03/2024)  Housing: Low Risk (07/03/2024)  Transportation Needs: No Transportation Needs (07/03/2024)  Utilities: Not At Risk (07/03/2024)  Depression (PHQ2-9): Low Risk (01/07/2024)  Tobacco Use: Low Risk (07/06/2024)    Readmission Risk Interventions    07/06/2024    4:07 PM  Readmission Risk Prevention Plan  Post Dischage Appt Complete  Medication Screening Complete  Transportation Screening Complete

## 2024-07-06 NOTE — Plan of Care (Signed)

## 2024-07-06 NOTE — Consult Note (Incomplete)
 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  Application of skin substitute allograft.  Negative pressure wound therapy.  - 1/30 CRP 24.3, ESR 113  Recommendations      Rest of the management as per the primary team. Please call with questions or concerns.  Thank you for the consult  __________________________________________________________________________________________________________ HPI and Hospital Course: 44 year old female with prior history of DM, anemia, chronic pain syndrome who presented to the ED on 1/30 with a wound in her left foot for a week with increasing swelling, redness, discharge.  She was treating with topical antibiotics at home without improvement. she was seen at the podiatrist office on day of ED arrival with concern with severe infection and advised to come to the ED. No reported fevers, chills.   At ED hypertensive, tachycardic, afebrile ( later  fevered) Labs remarkable for BG 441, ALP 133, albumin 3.4, WBC 12.6 HIV NR 1/30 blood cx 2/2 sets NG in 2 days  Was given IV vancomycin , IV Unasyn , IVF, IV morphine  and IV Zofran .    ROS: General- Denies fever, chills, loss of appetite and loss of weight HEENT - Denies headache, blurry vision, neck pain, sinus pain Chest - Denies any chest pain, SOB or cough CVS- Denies any dizziness/lightheadedness, syncopal attacks, palpitations Abdomen- Denies any nausea, vomiting, abdominal pain, hematochezia and diarrhea Neuro - Denies any weakness, numbness, tingling sensation Psych - Denies any changes in mood irritability or depressive symptoms GU- Denies any burning, dysuria, hematuria or increased frequency of urination Skin - denies any rashes/lesions MSK - denies any joint pain/swelling or restricted ROM   Past Medical History:  Diagnosis Date   Allergy    Anemia    Diabetes mellitus without complication (HCC)    Past Surgical History:  Procedure Laterality Date   AMPUTATION Left 07/04/2024   Procedure: INCISION AND DRAINAGE WITH DEBRIEDMENT OF LEFT FOOT;  Surgeon: Gershon Donnice SAUNDERS, DPM;  Location: [facility] OR;  Service: Orthopedics/Podiatry;  Laterality: Left;   CESAREAN SECTION     DILATION AND CURETTAGE OF UTERUS     OVARIAN CYST REMOVAL      Scheduled Meds:  (feeding supplement) PROSource Plus  30 mL Oral BID BM   ascorbic acid   500 mg Oral Daily   enoxaparin  (LOVENOX ) injection  40 mg Subcutaneous Q24H   feeding supplement (GLUCERNA SHAKE)  237 mL Oral BID BM   insulin  aspart  0-5 Units Subcutaneous QHS   insulin  aspart  0-9 Units Subcutaneous TID WC   insulin  glargine-yfgn  24 Units Subcutaneous Daily   insulin  starter kit- pen needles  1 kit Other Once   living well with diabetes book   Does not apply Once   multivitamin with minerals  1 tablet Oral Daily   potassium chloride   20 mEq Oral BID   sodium chloride  flush  3 mL Intravenous Q12H   sodium chloride  flush  3 mL  Intravenous Q12H   zinc  sulfate (50mg  elemental zinc )  220 mg Oral Daily   Continuous Infusions:  piperacillin -tazobactam (ZOSYN )  IV 3.375 g (07/06/24 0523)   vancomycin  1,250 mg (07/06/24 0912)   PRN Meds:.acetaminophen  **OR** acetaminophen , labetalol , metoprolol  tartrate, morphine  injection, ondansetron  **OR** ondansetron  (ZOFRAN ) IV, oxyCODONE , sodium chloride  flush  Allergies[1]  Social History   Socioeconomic History   Marital status: Significant Other    Spouse name: Not on file   Number of children: 1   Years of education: Not on file   Highest education level: Some college, no degree  Occupational History   Occupation: Nursing Aide    Comment: [employer]  Tobacco Use   Smoking status: Never   Smokeless tobacco: Never  Vaping Use   Vaping status: Never Used  Substance and Sexual Activity   Alcohol use: Yes    Comment: socailly   Drug use: No   Sexual activity: Yes  Other Topics Concern   Not on file  Social History Narrative   Not on file   Social Drivers of Health   Tobacco Use: Low Risk (07/06/2024)   Patient History    Smoking Tobacco Use: Never    Smokeless Tobacco Use: Never    Passive Exposure: Not on file  Financial Resource Strain: Not on file  Food Insecurity: No Food Insecurity (07/03/2024)   Epic    Worried About Programme Researcher, Broadcasting/film/video in the Last Year: Never true    [employer] of Food in the Last Year: Never true  Transportation Needs: No Transportation Needs (07/03/2024)   Epic    Lack of Transportation (Medical): No    Lack of Transportation (Non-Medical): No  Physical Activity: Not on file  Stress: Not on file  Social Connections: Not on file  Intimate Partner Violence: Not At Risk (07/03/2024)   Epic    Fear of Current or Ex-Partner: No    Emotionally Abused: No    Physically Abused: No    Sexually Abused: No  Depression (PHQ2-9): Low Risk (01/07/2024)   Depression (PHQ2-9)    PHQ-2 Score: 0  Alcohol Screen:  Not on file  Housing: Low Risk (07/03/2024)   Epic    Unable to Pay for Housing in the Last Year: No    Number of Times Moved in the Last Year: 0  Homeless in the Last Year: No  Utilities: Not At Risk (07/03/2024)   Epic    Threatened with loss of utilities: No  Health Literacy: Not on file   Family History  Problem Relation Age of Onset   Kidney disease Mother    Diabetes Mother    Heart disease Father    Hyperlipidemia Father    Hypertension Father    Diabetes Sister    Breast cancer Neg Hx    Vitals BP 120/72 (BP Location: Right Arm)   Pulse (!) 107   Temp 98.8 F (37.1 C)   Resp 15   Ht 5' 9 (1.753 m)   Wt 90.7 kg   LMP 07/03/2024   SpO2 97%   BMI 29.53 kg/m     Physical Exam Constitutional:      Comments:   Cardiovascular:     Rate and Rhythm: Normal rate and regular rhythm.     Heart sounds: No murmur heard.   Pulmonary:     Effort: Pulmonary effort is normal.     Comments:   Abdominal:     Palpations: Abdomen is soft.     Tenderness:   Musculoskeletal:        General: No swelling or tenderness.   Skin:    Comments:   Neurological:     General: No focal deficit present.   Psychiatric:        Mood and Affect: Mood normal.    Pertinent Microbiology Results for orders placed or performed during the hospital encounter of 07/03/24  Blood culture (routine x 2)     Status: None (Preliminary result)   Collection Time: 07/03/24  5:35 PM   Specimen: BLOOD  Result Value Ref Range Status   Specimen Description BLOOD RIGHT ANTECUBITAL  Final   Special Requests   Final    BOTTLES DRAWN AEROBIC AND ANAEROBIC Blood Culture results may not be optimal due to an inadequate volume of blood received in culture bottles   Culture   Final    NO GROWTH 3 DAYS Performed at [facility], [address] [place], [place] [address]    Report Status PENDING  Incomplete  Blood culture (routine x 2)     Status: None (Preliminary result)   Collection Time:  07/03/24  8:16 PM   Specimen: BLOOD  Result Value Ref Range Status   Specimen Description BLOOD LEFT ANTECUBITAL  Final   Special Requests   Final    BOTTLES DRAWN AEROBIC AND ANAEROBIC Blood Culture adequate volume   Culture   Final    NO GROWTH 3 DAYS Performed at [facility], [address] [place], [place] [address]    Report Status PENDING  Incomplete  Aerobic/Anaerobic Culture w Gram Stain (surgical/deep wound)     Status: None (Preliminary result)   Collection Time: 07/04/24  8:41 AM   Specimen: Soft Tissue, Other  Result Value Ref Range Status   Specimen Description TISSUE  Final   Special Requests CULTURES OF LEFT FOOT  Final   Gram Stain   Final    RARE SQUAMOUS EPITHELIAL CELLS PRESENT WBC PRESENT, PREDOMINANTLY PMN ABUNDANT GRAM POSITIVE COCCI    Culture   Final    MODERATE GROUP B STREP(S.AGALACTIAE)ISOLATED TESTING AGAINST S. AGALACTIAE NOT ROUTINELY PERFORMED DUE TO PREDICTABILITY OF AMP/PEN/VAN SUSCEPTIBILITY. Performed at [facility], [address] [place], [place] [address]    Report Status PENDING  Incomplete   Pertinent Lab seen by me:  Latest Ref Rng & Units 07/05/2024    4:10 AM 07/04/2024   10:42 AM 07/03/2024   10:26 AM  CBC  WBC 4.0 - 10.5 K/uL 8.2  7.9  12.6   Hemoglobin 12.0 - 15.0 g/dL 89.6  9.7  87.7   Hematocrit 36.0 - 46.0 % 32.2  30.3  38.5   Platelets 150 - 400 K/uL 309  282  390       Latest Ref Rng & Units 07/06/2024    3:13 AM 07/05/2024    9:30 AM 07/04/2024   10:42 AM  CMP  Glucose 70 - 99 mg/dL 816  787  755   BUN 6 - 20 mg/dL 6  6  8    Creatinine 0.44 - 1.00 mg/dL 9.36  9.34  9.33   Sodium 135 - 145 mmol/L 134  133  132   Potassium 3.5 - 5.1 mmol/L 3.4  3.9  3.9   Chloride 98 - 111 mmol/L 97  100  101   CO2 22 - 32 mmol/L 23  22  24    Calcium 8.9 - 10.3 mg/dL 8.1  8.4  8.4   Total Protein 6.5 - 8.1 g/dL   6.5   Total Bilirubin 0.0 - 1.2 mg/dL   0.3   Alkaline Phos 38 - 126 U/L   100   AST 15 - 41 U/L   42    ALT 0 - 44 U/L   53      Pertinent Imagings/Other Imagings Plain films and CT images have been personally visualized and interpreted; radiology reports have been reviewed. Decision making incorporated into the Impression / Recommendations.  I personally spent a total of 80 minutes in the care of the patient today including {Time Based Coding:[id number]}.   Annalee Orem, MD Infectious Disease Physician [facility] Pager: [phone]      [1]  Allergies Allergen Reactions   Other Anaphylaxis and Itching    epidural   Latex Other (See Comments) and Swelling    Other reaction(s): Generalized Edema (intolerance)   Pollen Extract Other (See Comments)    Red eyes, itchy throat   Sulfa Antibiotics Other (See Comments)    Unknown   "

## 2024-07-06 NOTE — Anesthesia Preprocedure Evaluation (Signed)
"                                    Anesthesia Evaluation  Patient identified by MRN, date of birth, ID band Patient awake    Reviewed: Allergy & Precautions, H&P , NPO status , Patient's Chart, lab work & pertinent test results  Airway Mallampati: II   Neck ROM: full    Dental   Pulmonary neg pulmonary ROS   breath sounds clear to auscultation       Cardiovascular negative cardio ROS  Rhythm:regular Rate:Normal     Neuro/Psych  Neuromuscular disease    GI/Hepatic   Endo/Other  diabetes, Type 2    Renal/GU      Musculoskeletal   Abdominal   Peds  Hematology   Anesthesia Other Findings   Reproductive/Obstetrics                              Anesthesia Physical Anesthesia Plan  ASA: 2  Anesthesia Plan: General   Post-op Pain Management:    Induction: Intravenous  PONV Risk Score and Plan: 3 and Ondansetron , Dexamethasone , Midazolam  and Treatment may vary due to age or medical condition  Airway Management Planned: LMA  Additional Equipment:   Intra-op Plan:   Post-operative Plan: Extubation in OR  Informed Consent: I have reviewed the patients History and Physical, chart, labs and discussed the procedure including the risks, benefits and alternatives for the proposed anesthesia with the patient or authorized representative who has indicated his/her understanding and acceptance.     Dental advisory given  Plan Discussed with: CRNA, Anesthesiologist and Surgeon  Anesthesia Plan Comments:         Anesthesia Quick Evaluation  "

## 2024-07-06 NOTE — Op Note (Signed)
 Full Operative Report  Date of Operation: 10:54 AM, 07/06/2024   Patient: Patricia Burton - 44 y.o. female  Surgeon: Malvin Marsa FALCON, DPM   Assistant: None  Diagnosis: Necrotizing soft tissue infection of the left foot, osteomyelitis distal fifth ray  Procedure:  1.  Extensive soft tissue debridement with partial fifth ray amputation, left foot 2.  Application skin substitute allograft 11.5 x 7 x 1 cm, left foot 3.  Application negative pressure wound therapy 11.5 x 7 x 1 cm, left foot    Anesthesia: Anesthesia type not filed in the log.  Maryclare Cornet, MD  Anesthesiologist: Maryclare Cornet, MD CRNA: Burton Dus, CRNA   Estimated Blood Loss: 20 mL  Hemostasis: 1) Anatomical dissection, mechanical compression, electrocautery 2) no tourniquet was used during the procedure  Implants: Implant Name Type Inv. Item Serial No. Manufacturer Lot No. LRB No. Used Action  GRAFT MATRIX 2 LAYER [id number] - [id number] Graft GRAFT MATRIX 2 LAYER 10X10  [employer] [id number] Left 1 Implanted  POWDER [employer] FINE 500 - [id number] Miscellaneous POWDER [employer] FINE 500  [employer] [id number] Left 1 Implanted    Materials: Skin staples Prolene 3-0 black foam sponge  Injectables: 1) Pre-operatively: 20 cc of 50:50 mixture 1%lidocaine  plain and 0.5% marcaine  plain 2) Post-operatively: None   Specimens: - Pathology: Partial fifth ray with proximal margin being the cut surface - Microbiology: Deep tissue culture from wound with purulence   Antibiotics: IV antibiotics given per schedule on the floor  Drains: None  Complications: Patient tolerated the procedure well without complication.   Operative findings: As below in detailed report  Indications for Procedure: Patricia Burton presents to [employer], Marsa FALCON, DPM with a chief complaint of open surgical site with concern for residual osteomyelitis of the distal fifth ray and  residual soft tissue infection status post prior debridement in the OR 2 days ago.  Returning for repeat debridement with partial fifth ray amputation and possible graft and VAC application as needed.  The patient has failed conservative treatments of various modalities. At this time the patient has elected to proceed with surgical correction. All alternatives, risks, and complications of the procedures were thoroughly explained to the patient. Patient exhibits appropriate understanding of all discussion points and informed consent was signed and obtained in the chart with no guarantees to surgical outcome given or implied.  Description of Procedure: Patient was brought to the operating room. Patient remained on their hospital bed in the supine position. A surgical timeout was performed and all members of the operating room, the procedure, and the surgical site were identified. anesthesia occurred as per anesthesia record. Local anesthetic as previously described was then injected about the operative field in a local infiltrative block.  The operative lower extremity as noted above was then prepped and draped in the usual sterile manner. The following procedure then began.  Attention was directed to the left lower extremity.  The ulceration was explored both dorsally and laterally over the fifth metatarsal as well as in the fifth toe.  There was noted to be significant purulence coming from the fifth toe as well as the dorsal aspect of the foot.  Decision made at therefore made to proceed with partial fifth ray amputation as we had previously discussed was a possibility with the patient.  A racquet type incision was made over the dorsal medial aspect of the 5th metatarsal, including the entire digit. A full thickness incision was made down to bone  using a #15 blade. The incision was continued through the soft tissue down to the shaft of the fifth metatarsal. At this time, 3-5cc of purulent drainage was noted  near the metatarsal head. A deep wound culture was taken with rongeur at this time and passed off the field. Using a #15 blade, the digit was then disarticulated in its entirety at the metatarsophalangeal joint and freed of all soft tissue attachments. The specimen was passed off the field and sent for gross pathology. Next, a 15 blade was then used to free up the periosteum on the metatarsal shaft and debride any necrotic and fibrotic tissues present laterally. Using a sagittal saw, the metatarsal was cut in a dorsal distal to plantar proximal orientation and beveled so as to be less prominent laterally.   The bone was passed off the field and sent as a gross specimen with proximal margin being the cut surface.  All remaining non-viable and necrotic tissues were sharply resected excisionally with 15 blade and rongeur and removed.  As there was significant necrotic tissue dorsally as well as purulence coming from the dorsal I&D site decision was made to debride this skin and underlying there was to be significant purulence please see intraoperative picture.  Further debridement was carried out with resection of a skin bridge dorsally that would not have survived given the appearance of the skin and underlying infection.  The wound margins were excised with 15 blade.  Further extensive stational debridement was performed with rongeur to the level of healthy bleeding tissue.  No further purulent tissue or necrotic tissue was noted at the completion of debridement.  Extensor and flexors tendons were grasped with a hemostat and cut proximally. The surgical site was then flushed with 3000ml of saline with pulse lavage.  Hemostasis was then achieved as needed with electrocautery  Next the wound was measured.  The wound was found to be 11.5 x 7 x 1 cm postdebridement.  The wound was noted to be clean and healthy at this time and appropriate for graft application to promote healing.  Initially 1/2 g of myriad powder was  placed within the wound bed.  Then overlying this a 10 x 10 sheet of myriad matrix was applied to the wound.  A retention stitch of Prolene 3 oh was placed in the center of the wound bed so as to keep the graft well adhered to the wound bed.  A piece of Sorbact nonadherent dressing was placed overlying the graft which was well adhered to the wound bed.  A small amount of surgical lubricant was then applied to the Sorbact so as to keep the wound bed moist and prevent adherence to the [facility]  Next decision was made to apply negative pressure wound therapy to assist with granulation formation.  The wound measured 11.5 x 7 x 1 cm.  Black foam sponge was cut to the above measurement and this was applied within the wound bed.  The wound VAC was covered with derma tack drape and this was a set 125 mm of suction continuous medium with no leaks detected.  The surgical site was then dressed with 4 x 4 Kerlix Ace. The patient tolerated both the procedure and anesthesia well with vital signs stable throughout. The patient was transferred in good condition and all vital signs stable  from the OR to recovery under the discretion of anesthesia.  Condition: Vital signs stable, neurovascular status unchanged from preoperative   Surgical plan:  Due to the severity  of significant soft tissue necrosis resembling a necrotizing soft tissue infection extensive debridement was needed to be performed including the left lateral forefoot midfoot and dorsal foot.  There is full-thickness ulceration to tendon dorsally and laterally to the fifth metatarsal base.  At completion of the procedure no further necrotic or infected tissue was thought to be present in the wound bed.  Hopeful for clean margin of the fifth metatarsal.  Follow cultures and pathology.  Patient will need long-term wound VAC therapy given severity and extent of the wound status postdebridement.  Wound was appropriately clean and healthy for graft application which was  applied today.  Leave VAC intact until Friday at the earliest for first change.  Continue broad-spectrum IV antibiotics and recommend ID consultation.  The patient will be nonweightbearing in a postop shoe to the operative limb until further instructed. The dressing is to remain clean, dry, and intact. Will continue to follow unless noted elsewhere.   Marsa Honour, DPM [facility]

## 2024-07-06 NOTE — Discharge Instructions (Signed)

## 2024-07-06 NOTE — Transfer of Care (Signed)
 Immediate Anesthesia Transfer of Care Note  Patient: Patricia Burton  Procedure(s) Performed: AMPUTATION, FOOT, RAY (Left: Foot) DEBRIDEMENT, WOUND (Left: Foot)  Patient Location: PACU  Anesthesia Type:General  Level of Consciousness: awake, alert , and oriented  Airway & Oxygen Therapy: Patient Spontanous Breathing and Patient connected to nasal cannula oxygen  Post-op Assessment: Report given to RN and Post -op Vital signs reviewed and stable  Post vital signs: Reviewed and stable  Last Vitals:  Vitals Value Taken Time  BP 118/71 07/06/24 10:53  Temp    Pulse    Resp 20 07/06/24 10:56  SpO2    Vitals shown include unfiled device data.  Last Pain:  Vitals:   07/06/24 0931  TempSrc:   PainSc: 0-No pain      Patients Stated Pain Goal: 0 (07/05/24 1842)  Complications: No notable events documented.

## 2024-07-06 NOTE — Anesthesia Procedure Notes (Signed)
 Procedure Name: LMA Insertion Date/Time: 07/06/2024 10:08 AM  Performed by: Delores Dus, CRNAPre-anesthesia Checklist: Patient identified, Emergency Drugs available, Suction available, Patient being monitored and Timeout performed Patient Re-evaluated:Patient Re-evaluated prior to induction Oxygen Delivery Method: Circle system utilized Preoxygenation: Pre-oxygenation with 100% oxygen Induction Type: IV induction LMA Size: 4.0 Number of attempts: 1

## 2024-07-07 DIAGNOSIS — M8600 Acute hematogenous osteomyelitis, unspecified site: Secondary | ICD-10-CM

## 2024-07-07 DIAGNOSIS — B951 Streptococcus, group B, as the cause of diseases classified elsewhere: Secondary | ICD-10-CM

## 2024-07-07 DIAGNOSIS — E11628 Type 2 diabetes mellitus with other skin complications: Secondary | ICD-10-CM

## 2024-07-07 DIAGNOSIS — E11621 Type 2 diabetes mellitus with foot ulcer: Secondary | ICD-10-CM

## 2024-07-07 DIAGNOSIS — M726 Necrotizing fasciitis: Secondary | ICD-10-CM

## 2024-07-07 LAB — GLUCOSE, CAPILLARY
Glucose-Capillary: 312 mg/dL — ABNORMAL HIGH (ref 70–99)
Glucose-Capillary: 307 mg/dL — ABNORMAL HIGH (ref 70–99)
Glucose-Capillary: 121 mg/dL — ABNORMAL HIGH (ref 70–99)
Glucose-Capillary: 145 mg/dL — ABNORMAL HIGH (ref 70–99)
Glucose-Capillary: 190 mg/dL — ABNORMAL HIGH (ref 70–99)

## 2024-07-07 LAB — BASIC METABOLIC PANEL WITH GFR
Anion gap: 12 (ref 5–15)
BUN: 7 mg/dL (ref 6–20)
CO2: 22 mmol/L (ref 22–32)
Calcium: 8.5 mg/dL — ABNORMAL LOW (ref 8.9–10.3)
Chloride: 101 mmol/L (ref 98–111)
Creatinine, Ser: 0.66 mg/dL (ref 0.44–1.00)
GFR, Estimated: >60 mL/min
Glucose, Bld: 257 mg/dL — ABNORMAL HIGH (ref 70–99)
Potassium: 3.8 mmol/L (ref 3.5–5.1)
Sodium: 135 mmol/L (ref 135–145)

## 2024-07-07 MED ORDER — SODIUM CHLORIDE 0.9 % IV SOLN
3.0000 g | Freq: Four times a day (QID) | INTRAVENOUS | Status: DC
  Administered 2024-07-07 – 2024-07-10 (×11): 3 g via INTRAVENOUS
  Filled 2024-07-07 (×11): qty 8

## 2024-07-07 NOTE — Inpatient Diabetes Management (Signed)
 Inpatient Diabetes Program Recommendations  AACE/ADA: New Consensus Statement on Inpatient Glycemic Control (2015)  Target Ranges:  Prepandial:   less than 140 mg/dL      Peak postprandial:   less than 180 mg/dL (1-2 hours)      Critically ill patients:  140 - 180 mg/dL   Lab Results  Component Value Date   GLUCAP 121 (H) 07/07/2024   HGBA1C 11.6 (H) 07/03/2024    Spoke with patient regarding outpatient diabetes management. Patient was last seen by PCP last fall and was started on Metformin  at that time, however, lost to follow up due to loss of job/insurance.  Reviewed patient's current A1c of 11.6%. Explained what a A1c is and what it measures. Also reviewed goal A1c with patient, importance of good glucose control @ home, and blood sugar goals. Reviewed patho of DM, need for insulin , role of pancreas, current glucose trends, hypo vs hyper glycemia, interventions, differences between long acting vs short acting insulin , 70/30, when to take, GLPs, CGM, frequency of checking CBGs, when to call MD, vascular changes and commorbidities.  Denies drinking sugary beverages. Reviewed plate method, basic CHO counting, importance of protein and overall CHO mindfulness. Dietitian consult placed.  TOC working on community education officer. Awaiting plan for insurance status to determine CGM and insulin  recommendations for discharge. [id number] placed. Educated patient on insulin  pen use at home. Reviewed contents of insulin  flexpen starter kit. Reviewed all steps of insulin  pen including attachment of needle, 2-unit air shot, dialing up dose, giving injection, removing needle, disposal of sharps, storage of unused insulin , disposal of insulin  etc. Patient able to provide successful return demonstration. Also reviewed troubleshooting with insulin  pen. MD to give patient Rxs for insulin  pens and insulin  pen needles. Follow up on 2/4.  Thanks, Tinnie Minus, MSN, RNC-OB Diabetes Coordinator [phone] (8a-5p)

## 2024-07-07 NOTE — Progress Notes (Signed)
 " PROGRESS NOTE    Patricia Burton  FMW:[id number] DOB: Oct 28, 1980 DOA: 07/03/2024 PCP: Thedora Garnette HERO, MD    Brief Narrative:  44 year old with non-insulin -dependent diabetes type 2, chronic pain syndrome who was following up at podiatry clinic for left-sided foot infection preceded by injury on the toes, worsening pain and swelling so sent to ER for admission and surgical intervention.  In the emergency room heart rate up to 141, blood glucose 441.  WBC count 12.6.  Lactic acid was normal.  Blood cultures were drawn. CT scan did not show any evidence of bony abnormality but showed soft tissue swelling and possible abscess. Patient underwent debridement 1/31, blood cultures negative.  Surgical cultures with gram-positive cocci. Underwent debridement 2/2, extensive soft tissue debridement and fifth toe ray amputation.  Wound VAC placement.  Subjective:  Patient seen and examined.  No overnight events.  Pain is controlled now.  Blood sugars elevated, however she missed her dose of insulin  yesterday when she went to the OR.  It was not given after she came back. Fever is subsiding.   Assessment & Plan:   Sepsis secondary to diabetic foot infection, left foot cellulitis and abscess: Blood cultures were drawn.  No growth so far. currently on vancomycin  and Zosyn . Patient underwent surgical resection, debridement.   Surgical cultures with Streptococcus. Extensive soft tissue debridement and fifth toe ray amputation Adequate pain medications. Mobilize with postop shoes.  Nonweightbearing as instructed. Given extensive soft tissue infection, would need long-term antibiotic management.  ID to consult.  Type 2 diabetes, uncontrolled with hyperglycemia: Presents with blood glucose of 441. At home on metformin  only. A1c is 11.6.  Inadequately treated.  Starting on long-acting insulin , increasing dose.  Likely home on long-acting insulin  and metformin .  Consult dietitian. Patient to learn  insulin  techniques      DVT prophylaxis: enoxaparin  (LOVENOX ) injection 40 mg Start: 07/03/24 2100 SCDs Start: 07/03/24 2015 Place TED hose Start: 07/03/24 2015   Code Status: Full code Family Communication: None at the bedside Disposition Plan: Status is: Inpatient Remains inpatient appropriate because: Immediate postop, IV antibiotics inpatient procedures     Consultants:  Podiatry  Procedures:  I&D and debridement  Antimicrobials:  Vancomycin  and Zosyn  1/30---     Objective: Vitals:   07/06/24 1222 07/06/24 1939 07/07/24 0509 07/07/24 0835  BP: 120/72 129/73 138/79 129/80  Pulse: (!) 107 (!) 118  (!) 106  Resp:      Temp: 98.8 F (37.1 C) 100.2 F (37.9 C) 98.6 F (37 C)   TempSrc:      SpO2: 97% 96% 99% 98%  Weight:      Height:        Intake/Output Summary (Last 24 hours) at 07/07/2024 1052 Last data filed at 07/07/2024 0606 Gross per 24 hour  Intake 560.62 ml  Output --  Net 560.62 ml   Filed Weights   07/03/24 1018 07/06/24 0921  Weight: 90.7 kg 90.7 kg    Examination:  General: looks comfortable now.  Pleasant and interactive. Cardiovascular: S1-S2 normal.  Regular rate rhythm.   Respiratory: Bilateral clear.  No added sounds. Gastrointestinal: Soft.  Nontender.  Bowel sound present. Ext: Left foot on immediate postop dressing.  Wound VAC with minimal serosanguinous drainage     Data Reviewed: I have personally reviewed following labs and imaging studies  CBC: Recent Labs  Lab 07/03/24 1026 07/04/24 1042 07/05/24 0410  WBC 12.6* 7.9 8.2  NEUTROABS 10.5*  --  5.6  HGB 12.2 9.7*  10.3*  HCT 38.5 30.3* 32.2*  MCV 77.0* 75.8* 75.6*  PLT 390 282 309   Basic Metabolic Panel: Recent Labs  Lab 07/03/24 2250 07/04/24 1042 07/05/24 0410 07/05/24 0930 07/06/24 0313 07/07/24 0513  NA 128* 132*  --  133* 134* 135  K 3.9 3.9  --  3.9 3.4* 3.8  CL 94* 101  --  100 97* 101  CO2 22 24  --  22 23 22   GLUCOSE 334* 244*  --  212* 183* 257*   BUN 9 8  --  6 6 7   CREATININE 0.73 0.66  --  0.65 0.63 0.66  CALCIUM 8.5* 8.4*  --  8.4* 8.1* 8.5*  MG  --   --  1.8  --   --   --   PHOS  --   --  2.3*  --   --   --    GFR: Estimated Creatinine Clearance: 106.5 mL/min (by C-G formula based on SCr of 0.66 mg/dL). Liver Function Tests: Recent Labs  Lab 07/03/24 1026 07/04/24 1042  AST 29 42*  ALT 38 53*  ALKPHOS 133* 100  BILITOT 0.4 0.3  PROT 8.1 6.5  ALBUMIN 3.4* 2.5*   No results for input(s): LIPASE, AMYLASE in the last 168 hours. No results for input(s): AMMONIA in the last 168 hours. Coagulation Profile: No results for input(s): INR, PROTIME in the last 168 hours. Cardiac Enzymes: No results for input(s): CKTOTAL, CKMB, CKMBINDEX, TROPONINI in the last 168 hours. BNP (last 3 results) No results for input(s): PROBNP in the last 8760 hours. HbA1C: No results for input(s): HGBA1C in the last 72 hours.  CBG: Recent Labs  Lab 07/06/24 1054 07/06/24 1224 07/06/24 2158 07/07/24 0558 07/07/24 0935  GLUCAP 129* 135* 280* 312* 307*   Lipid Profile: No results for input(s): CHOL, HDL, LDLCALC, TRIG, CHOLHDL, LDLDIRECT in the last 72 hours. Thyroid Function Tests: No results for input(s): TSH, T4TOTAL, FREET4, T3FREE, THYROIDAB in the last 72 hours.  Anemia Panel: No results for input(s): VITAMINB12, FOLATE, FERRITIN, TIBC, IRON, RETICCTPCT in the last 72 hours. Sepsis Labs: Recent Labs  Lab 07/03/24 1049 07/03/24 1759  LATICACIDVEN 1.2 1.4    Recent Results (from the past 240 hours)  Blood culture (routine x 2)     Status: None (Preliminary result)   Collection Time: 07/03/24  5:35 PM   Specimen: BLOOD  Result Value Ref Range Status   Specimen Description BLOOD RIGHT ANTECUBITAL  Final   Special Requests   Final    BOTTLES DRAWN AEROBIC AND ANAEROBIC Blood Culture results may not be optimal due to an inadequate volume of blood received in culture  bottles   Culture   Final    NO GROWTH 4 DAYS Performed at [facility], [address] [place], [place] [address]    Report Status PENDING  Incomplete  Blood culture (routine x 2)     Status: None (Preliminary result)   Collection Time: 07/03/24  8:16 PM   Specimen: BLOOD  Result Value Ref Range Status   Specimen Description BLOOD LEFT ANTECUBITAL  Final   Special Requests   Final    BOTTLES DRAWN AEROBIC AND ANAEROBIC Blood Culture adequate volume   Culture   Final    NO GROWTH 4 DAYS Performed at [facility], [address] [place], [place] [address]    Report Status PENDING  Incomplete  Aerobic/Anaerobic Culture w Gram Stain (surgical/deep wound)     Status: None (  Preliminary result)   Collection Time: 07/04/24  8:41 AM   Specimen: Soft Tissue, Other  Result Value Ref Range Status   Specimen Description TISSUE  Final   Special Requests CULTURES OF LEFT FOOT  Final   Gram Stain   Final    RARE SQUAMOUS EPITHELIAL CELLS PRESENT WBC PRESENT, PREDOMINANTLY PMN ABUNDANT GRAM POSITIVE COCCI    Culture   Final    MODERATE GROUP B STREP(S.AGALACTIAE)ISOLATED TESTING AGAINST S. AGALACTIAE NOT ROUTINELY PERFORMED DUE TO PREDICTABILITY OF AMP/PEN/VAN SUSCEPTIBILITY. Performed at [facility], [address] [place], [place] [address]    Report Status PENDING  Incomplete  Aerobic/Anaerobic Culture w Gram Stain (surgical/deep wound)     Status: None (Preliminary result)   Collection Time: 07/06/24 10:20 AM   Specimen: Foot, Left; Tissue  Result Value Ref Range Status   Specimen Description TISSUE  Final   Special Requests LEFT FOOT  Final   Gram Stain   Final    NO WBC SEEN RARE GRAM POSITIVE COCCI IN PAIRS Performed at [facility], [address], [place], [place] [address]    Culture PENDING  Incomplete   Report Status PENDING  Incomplete         Radiology Studies: DG Foot 2 Views Left Result Date: 07/06/2024 CLINICAL DATA:  Status post  partial amputation of the right fifth ray EXAM: LEFT FOOT - 2 VIEW COMPARISON:  Recent prior radiographs of the left foot 07/03/2024 FINDINGS: Interval partial amputation of the right fifth ray with approximately 3.9 cm of remaining proximal metatarsal. A wound VAC is in place. Diffuse soft tissue swelling. No acute fracture or malalignment involving the remaining bones and joints. IMPRESSION: Surgical changes of partial amputation of the right fifth ray with approximately 3.9 cm of remaining proximal metatarsal. Wound VAC in place. Electronically Signed   By: Wilkie Lent M.D.   On: 07/06/2024 12:38   VAS US  ABI WITH/WO TBI Result Date: 07/05/2024  LOWER EXTREMITY DOPPLER STUDY Patient Name:  CURRY DULSKI  Date of Exam:   07/05/2024 Medical Rec #: [id number]        Accession #:    [id number] Date of Birth: 10/31/1980        Patient Gender: F Patient Age:   1 years Exam Location:  [facility] Procedure:      VAS US  ABI WITH/WO TBI Referring Phys: DONNICE FEES --------------------------------------------------------------------------------  Indications: Ulceration. High Risk Factors: Uncontrolled diabetes. Other Factors: Status post drainage and debridement of left foot 07/04/24.  Limitations: Today's exam was limited due to patient intolerant to cuff              pressure. Comparison Study: No prior study on file Performing Technologist: Rachel Alberta RVS  Examination Guidelines: A complete evaluation includes at minimum, Doppler waveform signals and systolic blood pressure reading at the level of bilateral brachial, anterior tibial, and posterior tibial arteries, when vessel segments are accessible. Bilateral testing is considered an integral part of a complete examination. Photoelectric Plethysmograph (PPG) waveforms and toe systolic pressure readings are included as required and additional duplex testing as needed. Limited examinations for reoccurring indications may be performed as noted.  ABI  Findings: +---------+------------------+-----+---------+--------+ Right    Rt Pressure (mmHg)IndexWaveform Comment  +---------+------------------+-----+---------+--------+ Brachial 135                    triphasic         +---------+------------------+-----+---------+--------+ PTA      135  1.00 triphasic         +---------+------------------+-----+---------+--------+ DP       134               0.99 biphasic          +---------+------------------+-----+---------+--------+ Great Toe136               1.01 Normal            +---------+------------------+-----+---------+--------+ +---------+------------------+-----+-----------+-------+ Left     Lt Pressure (mmHg)IndexWaveform   Comment +---------+------------------+-----+-----------+-------+ Brachial 130                    triphasic          +---------+------------------+-----+-----------+-------+ PTA                             multiphasic        +---------+------------------+-----+-----------+-------+ DP                              multiphasic        +---------+------------------+-----+-----------+-------+ Great Toe129               0.96 Normal             +---------+------------------+-----+-----------+-------+ +-------+----------------+-----------+------------+------------+ ABI/TBIToday's ABI     Today's TBIPrevious ABIPrevious TBI +-------+----------------+-----------+------------+------------+ Right  1.0             1.01                                +-------+----------------+-----------+------------+------------+ Left   normal waveforms0.96                                +-------+----------------+-----------+------------+------------+  Summary: Right: Resting right ankle-brachial index is within normal range. The right toe-brachial index is normal.  Left: The left toe-brachial index is normal.  Unable to attain ABI secondary to pain with cuff pressure, normal waveforms.  *See table(s) above for measurements and observations.  Electronically signed by Gaile New MD on 07/05/2024 at 5:31:20 PM.    Final         Scheduled Meds:  (feeding supplement) PROSource Plus  30 mL Oral BID BM   ascorbic acid   500 mg Oral Daily   enoxaparin  (LOVENOX ) injection  40 mg Subcutaneous Q24H   feeding supplement (GLUCERNA SHAKE)  237 mL Oral BID BM   insulin  aspart  0-5 Units Subcutaneous QHS   insulin  aspart  0-9 Units Subcutaneous TID WC   insulin  glargine-yfgn  24 Units Subcutaneous Daily   insulin  starter kit- pen needles  1 kit Other Once   living well with diabetes book   Does not apply Once   multivitamin with minerals  1 tablet Oral Daily   potassium chloride   20 mEq Oral BID   sodium chloride  flush  3 mL Intravenous Q12H   zinc  sulfate (50mg  elemental zinc )  220 mg Oral Daily   Continuous Infusions:  piperacillin -tazobactam (ZOSYN )  IV 12.5 mL/hr at 07/07/24 0606   vancomycin  1,250 mg (07/07/24 0901)     LOS: 4 days       Renato Applebaum, MD [facility]   "

## 2024-07-07 NOTE — Plan of Care (Signed)

## 2024-07-07 NOTE — TOC Progression Note (Addendum)
 Transition of Care [facility]) - Progression Note    Patient Details  Name: Patricia Burton MRN: [id number] Date of Birth: [birth year]  [facility]) CM/SW Contact  Rosaline JONELLE Joe, RN Phone Number: 07/07/2024, 10:24 AM  Clinical Narrative:    CM spoke with the patient at the bedside and she is calling her job to try and obtain her current insurance information.  Patient has no documented insurance at this time and no accepting home health agencies. Patient also declines home health in her home.  MD was notified that I was unable to obtain Outpatient wound care center appointments for wound vac since they do not do these dressing changes at the center.  MD states that he will speak with the patient at the bedside to discuss different option for her wound care needs.  Patient may need to follow up at Dr. Emmitt office for wound care at this time.  [facility] was called and notified that patient currently waiting on recommendations from MD for home wound care needs.    07/07/24 1221 - I called and spoke with [facility] HR representative and she states that patient currently has no insurance coverage at this time.  HR is calling to speak with the patient on the phone to help with providing the patient with insurance coverage.  07/07/24 1500 - I left another message with [facility] HR [phone] - asking that they follow up with the patient to assist with insurance update.  Patient states that she has not spoken with HR regarding [employer] coverage availability or not.  Patient is currently uninsured.  07/07/24 1611 - I spoke with the patient and patient is uninsured and not able to have insurance through her employer at this time.  The patient is agreeable to wound vac through charity if I am able to obtain charity WC through [facility] and establish some charity home health visits through [employer], patient's employer.  Darleene, CM with [facility] is check about charity home health through the corporate office and  will follow up with me.  I will update Dr. Malvin, Podiatry when I have a firm plan tomorrow.                     Expected Discharge Plan and Services                                               Social Drivers of Health (SDOH) Interventions SDOH Screenings   Food Insecurity: No Food Insecurity (07/03/2024)  Housing: Low Risk (07/03/2024)  Transportation Needs: No Transportation Needs (07/03/2024)  Utilities: Not At Risk (07/03/2024)  Depression (PHQ2-9): Low Risk (01/07/2024)  Tobacco Use: Low Risk (07/06/2024)    Readmission Risk Interventions    07/06/2024    4:07 PM  Readmission Risk Prevention Plan  Post Dischage Appt Complete  Medication Screening Complete  Transportation Screening Complete

## 2024-07-07 NOTE — TOC Progression Note (Signed)
 Transition of Care [facility]) - Progression Note    Patient Details  Name: Patricia Burton MRN: [id number] Date of Birth: May 07, 1981  [facility]) CM/SW Contact  Rosaline JONELLE Joe, RN Phone Number: 07/07/2024, 9:36 AM  Clinical Narrative:    CM met with the patient at the bedside and she states that she called the [facility] Benefits number to try and obtain insurance information so that I can obtain wound vac and set patient up with OP visits at Dr. Emmitt office and Outpatient wound care center for wound vac changes.  Patient currently has no listed insurance.                     Expected Discharge Plan and Services                                               Social Drivers of Health (SDOH) Interventions SDOH Screenings   Food Insecurity: No Food Insecurity (07/03/2024)  Housing: Low Risk (07/03/2024)  Transportation Needs: No Transportation Needs (07/03/2024)  Utilities: Not At Risk (07/03/2024)  Depression (PHQ2-9): Low Risk (01/07/2024)  Tobacco Use: Low Risk (07/06/2024)    Readmission Risk Interventions    07/06/2024    4:07 PM  Readmission Risk Prevention Plan  Post Dischage Appt Complete  Medication Screening Complete  Transportation Screening Complete

## 2024-07-07 NOTE — Progress Notes (Signed)
"  °  Subjective:  Patient ID: Patricia Burton, female    DOB: Jun 19, 1980,  MRN: [id number]  Chief Complaint  Patient presents with   Foot Pain   Wound Infection    DOS: 07/06/24 Procedure: 1.  Extensive soft tissue debridement with partial fifth ray amputation, left foot 2.  Application skin substitute allograft 11.5 x 7 x 1 cm, left foot 3.  Application negative pressure wound therapy 11.5 x 7 x 1 cm, left foot  44 y.o. female seen for post op check. She reports she is doing well, pain is controlled. Says she has been trying to keep weight off the left foot. We discussed issues with getting home health / vac approved, she thinks she has insurance and will be able to get vac approved.   Review of Systems: Negative except as noted in the HPI. Denies N/V/F/Ch.   Objective:   Constitutional Well developed. Well nourished.  Vascular Foot warm and well perfused. Capillary refill normal to all digits.   No calf pain with palpation  Neurologic Normal speech. Oriented to person, place, and time. Epicritic sensation diminished to left foot  Dermatologic Wound vac dressing c/d/I and functioning well. 100 mL ss drainage in canister   Orthopedic: S/p Left partial 5th ray amputation, graft and vac application   Radiographs: Surgical changes of partial amputation of the right fifth ray with approximately 3.9 cm of remaining proximal metatarsal. Wound VAC in place.  Pathology: pending  Micro: Initial cultures with group b strep  Assessment:   1. 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  POD # 1 s/p repeat debridement with partial 5th ray amputation, myriad matrix graft, wound vac left foot -Progressing as expected post op, pain controlled. Awaiting pathology and culture  data,  - Recommend ID consult for abx duration/route recs, appreciate recs -XR: expected post op changes -WB Status: NWB in post op shoe to left foot -Sutures: remain itnact . -Medications/ABX: continue IV abx pending further culture data, ID recs -Dressing: First vac change planned for Friday.  - TOC team attempting to get patients insurance information prior to making decisions re if possible to do home wound vac therapy.  - F/u Plan: will follow        Patricia Burton, DPM [facility]  "

## 2024-07-08 LAB — GLUCOSE, CAPILLARY
Glucose-Capillary: 137 mg/dL — ABNORMAL HIGH (ref 70–99)
Glucose-Capillary: 170 mg/dL — ABNORMAL HIGH (ref 70–99)
Glucose-Capillary: 200 mg/dL — ABNORMAL HIGH (ref 70–99)
Glucose-Capillary: 177 mg/dL — ABNORMAL HIGH (ref 70–99)

## 2024-07-08 LAB — CULTURE, BLOOD (ROUTINE X 2)
Culture: NO GROWTH
Culture: NO GROWTH
Special Requests: ADEQUATE

## 2024-07-08 LAB — BASIC METABOLIC PANEL WITH GFR
Anion gap: 8 (ref 5–15)
BUN: 8 mg/dL (ref 6–20)
CO2: 25 mmol/L (ref 22–32)
Calcium: 8.7 mg/dL — ABNORMAL LOW (ref 8.9–10.3)
Chloride: 103 mmol/L (ref 98–111)
Creatinine, Ser: 0.54 mg/dL (ref 0.44–1.00)
GFR, Estimated: >60 mL/min
Glucose, Bld: 190 mg/dL — ABNORMAL HIGH (ref 70–99)
Potassium: 4.3 mmol/L (ref 3.5–5.1)
Sodium: 136 mmol/L (ref 135–145)

## 2024-07-08 LAB — AEROBIC/ANAEROBIC CULTURE W GRAM STAIN (SURGICAL/DEEP WOUND): Gram Stain: NONE SEEN

## 2024-07-08 LAB — SURGICAL PATHOLOGY

## 2024-07-08 NOTE — Inpatient Diabetes Management (Signed)
 Inpatient Diabetes Program Recommendations  AACE/ADA: New Consensus Statement on Inpatient Glycemic Control (2015)  Target Ranges:  Prepandial:   less than 140 mg/dL      Peak postprandial:   less than 180 mg/dL (1-2 hours)      Critically ill patients:  140 - 180 mg/dL   Lab Results  Component Value Date   GLUCAP 170 (H) 07/08/2024   HGBA1C 11.6 (H) 07/03/2024    Review of Glycemic Control  Latest Reference Range & Units 07/07/24 12:31 07/07/24 16:22 07/07/24 21:05 07/08/24 08:35 07/08/24 11:32  Glucose-Capillary 70 - 99 mg/dL 878 (H) 854 (H) 809 (H) 137 (H) 170 (H)   Diabetes history: DM  Outpatient Diabetes medications:  Metformin  500 mg bid  Current orders for Inpatient glycemic control:  Novolog  0-9 units tid with meals and HS Lantus  24 units daily  Inpatient Diabetes Program Recommendations:   Spoke with patient regarding DM management.  She states she feels good about using insulin  pen and practiced giving self shot today. Gave patient flyer regarding [facility] so that she can get insulin  long term  at the [facility] pharmacy.  She will need to call them for insulin  refills after discharge.   We discussed glucose goals.  She says she has a meter at home but may need refill of strips.   Will follow.   Thanks,  Randall Bullocks, RN, BC-ADM Inpatient Diabetes Coordinator Pager [phone]  (8a-5p)

## 2024-07-08 NOTE — TOC Progression Note (Addendum)
 Transition of Care [facility]) - Progression Note    Patient Details  Name: Patricia Burton MRN: [id number] Date of Birth: Nov 18, 1980  [facility]) CM/SW Contact  Rosaline JONELLE Joe, RN Phone Number: 07/08/2024, 11:21 AM  Clinical Narrative:    CM spoke with Darleene, RNCM with [facility] and they have agreed to provide charity [facility] services 2 x per week through LOG.  Josefa Jes, MSW was notified and approved for charity visits for [facility] RN for wound vac changes 2 x per week for 3 weeks.  Patient completed the  [facility] charity wound vac screening form and this was faxed to Randine Pope, CM with [facility] to fax #[phone].  HH order for [facility] RN was placed for charity [facility] - to be co-signed by MD.  Patient has RW at home.  Patient will likely discharge after FRiday   - next wound vac dressing to be completed this Friday.  07/08/2024 - Patient was seen by Diabetes Coordinator and medications for home - including diabetes supplies and medications were asked by MD to be sent through [facility] pharmacy.  MATCH will be placed closer to discharge to assist with medications costs since patient is uninsured.  MD aware.  07/08/24 1618 [place] Randine, CM with [facility] company for wound vac is checking on screening for charity wound vac.  Will follow up tomorrow when I hear back from [facility].                     Expected Discharge Plan and Services                                               Social Drivers of Health (SDOH) Interventions SDOH Screenings   Food Insecurity: No Food Insecurity (07/03/2024)  Housing: Low Risk (07/03/2024)  Transportation Needs: No Transportation Needs (07/03/2024)  Utilities: Not At Risk (07/03/2024)  Depression (PHQ2-9): Low Risk (01/07/2024)  Tobacco Use: Low Risk (07/06/2024)    Readmission Risk Interventions    07/06/2024    4:07 PM  Readmission Risk Prevention Plan  Post Dischage Appt Complete  Medication Screening Complete  Transportation Screening Complete

## 2024-07-08 NOTE — Progress Notes (Signed)
 " PROGRESS NOTE    Patricia Burton  FMW:[id number] DOB: [birth year] DOA: 07/03/2024 PCP: Thedora Garnette HERO, MD    Brief Narrative:  44 year old with non-insulin -dependent diabetes type 2, chronic pain syndrome who was following up at podiatry clinic for left-sided foot infection preceded by injury on the toes, worsening pain and swelling so sent to ER for admission and surgical intervention.  In the emergency room heart rate up to 141, blood glucose 441.  WBC count 12.6.  Lactic acid was normal.  Blood cultures were drawn. CT scan did not show any evidence of bony abnormality but showed soft tissue swelling and possible abscess. Patient underwent debridement 1/31, blood cultures negative.  Surgical cultures with gram-positive cocci. Underwent debridement 2/2, extensive soft tissue debridement and fifth toe ray amputation.  Wound VAC placement.  Subjective:  Patient seen and examined.  Moderate pain persist but much better than before.  Afebrile now.  Using oral pain medications.  Blood sugars are better. Case management working on wound VAC at home.   Assessment & Plan:   Sepsis secondary to diabetic foot infection, left foot cellulitis and abscess: Found to have extensive soft tissue infection. Blood cultures so far. Treated with vancomycin  and Zosyn .  Surgical culture with Streptococcus agalactiae.  Currently on Unasyn . Extensive soft tissue debridement and fifth toe ray amputation Adequate pain medications. Mobilize with postop shoes.  Nonweightbearing as instructed. ID following for antibiotic management. Anticipate home with wound VAC.  Type 2 diabetes, uncontrolled with hyperglycemia: Presents with blood glucose of 441. At home on metformin  only. A1c is 11.6.  Inadequately treated.  Starting on long-acting insulin , increasing dose.  Likely home on long-acting insulin  and metformin .       DVT prophylaxis: enoxaparin  (LOVENOX ) injection 40 mg Start: 07/03/24 2100 SCDs Start:  07/03/24 2015 Place TED hose Start: 07/03/24 2015   Code Status: Full code Family Communication: None at the bedside Disposition Plan: Status is: Inpatient Remains inpatient appropriate because: Immediate postop, IV antibiotics, inpatient procedures     Consultants:  Podiatry ID  Procedures:  I&D and debridement  Antimicrobials:  Vancomycin  and Zosyn  1/30--- 2/3 Unasyn  2/3---     Objective: Vitals:   07/07/24 1620 07/07/24 1921 07/08/24 0032 07/08/24 0454  BP: 135/74 139/75 119/69 (!) 149/93  Pulse: 98 95 95 94  Resp:  17 17 17   Temp: 97.9 F (36.6 C) 97.8 F (36.6 C) 98.4 F (36.9 C) 98.2 F (36.8 C)  TempSrc:      SpO2: 100% 99% 99% 100%  Weight:      Height:        Intake/Output Summary (Last 24 hours) at 07/08/2024 1112 Last data filed at 07/07/2024 1500 Gross per 24 hour  Intake 250 ml  Output --  Net 250 ml   Filed Weights   07/03/24 1018 07/06/24 0921  Weight: 90.7 kg 90.7 kg    Examination:  General: looks comfortable now.  Pleasant and interactive. Cardiovascular: S1-S2 normal.  Regular rate rhythm.   Respiratory: Bilateral clear.  No added sounds. Gastrointestinal: Soft.  Nontender.  Bowel sound present. Ext: Left foot on immediate postop dressing.  Wound VAC with minimal serosanguinous drainage     Data Reviewed: I have personally reviewed following labs and imaging studies  CBC: Recent Labs  Lab 07/03/24 1026 07/04/24 1042 07/05/24 0410  WBC 12.6* 7.9 8.2  NEUTROABS 10.5*  --  5.6  HGB 12.2 9.7* 10.3*  HCT 38.5 30.3* 32.2*  MCV 77.0* 75.8* 75.6*  PLT 390  282 309   Basic Metabolic Panel: Recent Labs  Lab 07/04/24 1042 07/05/24 0410 07/05/24 0930 07/06/24 0313 07/07/24 0513 07/08/24 0255  NA 132*  --  133* 134* 135 136  K 3.9  --  3.9 3.4* 3.8 4.3  CL 101  --  100 97* 101 103  CO2 24  --  22 23 22 25   GLUCOSE 244*  --  212* 183* 257* 190*  BUN 8  --  6 6 7 8   CREATININE 0.66  --  0.65 0.63 0.66 0.54  CALCIUM 8.4*  --   8.4* 8.1* 8.5* 8.7*  MG  --  1.8  --   --   --   --   PHOS  --  2.3*  --   --   --   --    GFR: Estimated Creatinine Clearance: 106.5 mL/min (by C-G formula based on SCr of 0.54 mg/dL). Liver Function Tests: Recent Labs  Lab 07/03/24 1026 07/04/24 1042  AST 29 42*  ALT 38 53*  ALKPHOS 133* 100  BILITOT 0.4 0.3  PROT 8.1 6.5  ALBUMIN 3.4* 2.5*   No results for input(s): LIPASE, AMYLASE in the last 168 hours. No results for input(s): AMMONIA in the last 168 hours. Coagulation Profile: No results for input(s): INR, PROTIME in the last 168 hours. Cardiac Enzymes: No results for input(s): CKTOTAL, CKMB, CKMBINDEX, TROPONINI in the last 168 hours. BNP (last 3 results) No results for input(s): PROBNP in the last 8760 hours. HbA1C: No results for input(s): HGBA1C in the last 72 hours.  CBG: Recent Labs  Lab 07/07/24 0935 07/07/24 1231 07/07/24 1622 07/07/24 2105 07/08/24 0835  GLUCAP 307* 121* 145* 190* 137*   Lipid Profile: No results for input(s): CHOL, HDL, LDLCALC, TRIG, CHOLHDL, LDLDIRECT in the last 72 hours. Thyroid Function Tests: No results for input(s): TSH, T4TOTAL, FREET4, T3FREE, THYROIDAB in the last 72 hours.  Anemia Panel: No results for input(s): VITAMINB12, FOLATE, FERRITIN, TIBC, IRON, RETICCTPCT in the last 72 hours. Sepsis Labs: Recent Labs  Lab 07/03/24 1049 07/03/24 1759  LATICACIDVEN 1.2 1.4    Recent Results (from the past 240 hours)  Blood culture (routine x 2)     Status: None   Collection Time: 07/03/24  5:35 PM   Specimen: BLOOD  Result Value Ref Range Status   Specimen Description BLOOD RIGHT ANTECUBITAL  Final   Special Requests   Final    BOTTLES DRAWN AEROBIC AND ANAEROBIC Blood Culture results may not be optimal due to an inadequate volume of blood received in culture bottles   Culture   Final    NO GROWTH 5 DAYS Performed at [facility], [address],  [place], [place] [address]    Report Status 07/08/2024 FINAL  Final  Blood culture (routine x 2)     Status: None   Collection Time: 07/03/24  8:16 PM   Specimen: BLOOD  Result Value Ref Range Status   Specimen Description BLOOD LEFT ANTECUBITAL  Final   Special Requests   Final    BOTTLES DRAWN AEROBIC AND ANAEROBIC Blood Culture adequate volume   Culture   Final    NO GROWTH 5 DAYS Performed at [facility], [address], [place], [place] [address]    Report Status 07/08/2024 FINAL  Final  Aerobic/Anaerobic Culture w Gram Stain (surgical/deep wound)     Status: None (Preliminary result)   Collection Time: 07/04/24  8:41 AM   Specimen: Soft Tissue, Other  Result Value Ref Range Status   Specimen Description TISSUE  Final   Special Requests CULTURES OF LEFT FOOT  Final   Gram Stain   Final    RARE SQUAMOUS EPITHELIAL CELLS PRESENT WBC PRESENT, PREDOMINANTLY PMN ABUNDANT GRAM POSITIVE COCCI    Culture   Final    MODERATE GROUP B STREP(S.AGALACTIAE)ISOLATED TESTING AGAINST S. AGALACTIAE NOT ROUTINELY PERFORMED DUE TO PREDICTABILITY OF AMP/PEN/VAN SUSCEPTIBILITY. Performed at [facility], [address], [place], [place] [address]    Report Status PENDING  Incomplete  Aerobic/Anaerobic Culture w Gram Stain (surgical/deep wound)     Status: None (Preliminary result)   Collection Time: 07/06/24 10:20 AM   Specimen: Foot, Left; Tissue  Result Value Ref Range Status   Specimen Description TISSUE  Final   Special Requests LEFT FOOT  Final   Gram Stain NO WBC SEEN RARE GRAM POSITIVE COCCI IN PAIRS   Final   Culture   Final    FEW GROUP B STREP(S.AGALACTIAE)ISOLATED TESTING AGAINST S. AGALACTIAE NOT ROUTINELY PERFORMED DUE TO PREDICTABILITY OF AMP/PEN/VAN SUSCEPTIBILITY. Performed at [facility], [address], [place], [place] [address]    Report Status PENDING  Incomplete         Radiology Studies: No results found.       Scheduled Meds:   (feeding supplement) PROSource Plus  30 mL Oral BID BM   ascorbic acid   500 mg Oral Daily   enoxaparin  (LOVENOX ) injection  40 mg Subcutaneous Q24H   feeding supplement (GLUCERNA SHAKE)  237 mL Oral BID BM   insulin  aspart  0-5 Units Subcutaneous QHS   insulin  aspart  0-9 Units Subcutaneous TID WC   insulin  glargine-yfgn  24 Units Subcutaneous Daily   insulin  starter kit- pen needles  1 kit Other Once   living well with diabetes book   Does not apply Once   multivitamin with minerals  1 tablet Oral Daily   sodium chloride  flush  3 mL Intravenous Q12H   zinc  sulfate (50mg  elemental zinc )  220 mg Oral Daily   Continuous Infusions:  ampicillin -sulbactam (UNASYN ) IV 3 g (07/08/24 0824)     LOS: 5 days       Renato Applebaum, MD [facility]   "

## 2024-07-08 NOTE — Plan of Care (Signed)

## 2024-07-09 LAB — BASIC METABOLIC PANEL WITH GFR
Anion gap: 8 (ref 5–15)
BUN: 5 mg/dL — ABNORMAL LOW (ref 6–20)
CO2: 28 mmol/L (ref 22–32)
Calcium: 8.9 mg/dL (ref 8.9–10.3)
Chloride: 104 mmol/L (ref 98–111)
Creatinine, Ser: 0.49 mg/dL (ref 0.44–1.00)
GFR, Estimated: >60 mL/min
Glucose, Bld: 167 mg/dL — ABNORMAL HIGH (ref 70–99)
Potassium: 3.8 mmol/L (ref 3.5–5.1)
Sodium: 139 mmol/L (ref 135–145)

## 2024-07-09 LAB — GLUCOSE, CAPILLARY
Glucose-Capillary: 155 mg/dL — ABNORMAL HIGH (ref 70–99)
Glucose-Capillary: 208 mg/dL — ABNORMAL HIGH (ref 70–99)
Glucose-Capillary: 159 mg/dL — ABNORMAL HIGH (ref 70–99)
Glucose-Capillary: 133 mg/dL — ABNORMAL HIGH (ref 70–99)

## 2024-07-09 MED ORDER — METFORMIN HCL 850 MG PO TABS
850.0000 mg | ORAL_TABLET | Freq: Two times a day (BID) | ORAL | Status: DC
  Filled 2024-07-09 (×3): qty 1

## 2024-07-09 NOTE — Progress Notes (Signed)
 Physical Therapy Treatment Patient Details Name: Patricia Burton MRN: [id number] DOB: May 20, 1981 Today's Date: 07/09/2024   History of Present Illness Patricia Burton is a 44 y.o. female s/p partial L th ray amputation. Wound vac application due to necrotizing soft tissue infection of L foot and osteomyelitis in distal 5th ray. PMH: non-insulin -dependent DM type II, chronic pain syndrome    PT Comments  Pt received in supine and agreeable to session. Pt able to tolerate donning post op shoe with encouragement. Pt continues to demonstrate difficulty fully offloading with BUE to advance RLE while maintaining LLE NWB. Session focused on stair trial with RW, however pt became very anxious requiring max encouragement to attempt. Pt able to complete 1 step with mod A, but unable to maintain LLE NWB. Pt will require w/c for family to bump pt up into the house. Pt reports w/c will not fit in the hallway, so discussed use of knee scooter for other areas. Pt able to slightly improve offloading with BUE at end of session, but still unable to complete household distance. Pt continues to benefit from PT services to progress toward functional mobility goals.    If plan is discharge home, recommend the following: A little help with walking and/or transfers;A little help with bathing/dressing/bathroom;Assist for transportation;Help with stairs or ramp for entrance   Can travel by private vehicle        Equipment Recommendations  Wheelchair (measurements PT);Wheelchair cushion (measurements PT);Other (comment) (knee scooter if possible)    Recommendations for Other Services       Precautions / Restrictions Precautions Precautions: Fall Required Braces or Orthoses: Other Brace Other Brace: L post op shoe - in room Restrictions Weight Bearing Restrictions Per Provider Order: Yes LLE Weight Bearing Per Provider Order: Non weight bearing     Mobility  Bed Mobility Overal bed mobility: Modified  Independent                  Transfers Overall transfer level: Needs assistance Equipment used: Rolling walker (2 wheels) Transfers: Sit to/from Stand, Bed to chair/wheelchair/BSC Sit to Stand: Contact guard assist Stand pivot transfers: Min assist, Contact guard assist         General transfer comment: STS from EOB and transport chair with CGA for safety and cues for hand placement. Pivot with RW with cues for technique and intermittent min A for RW management and balance    Ambulation/Gait               General Gait Details: deferred for focus on stair trial   Stairs Stairs: Yes Stairs assistance: Mod assist Stair Management: Backwards, With walker Number of Stairs: 1 General stair comments: Max cues and encouragement due to anxiety. Pt tending to reach for rail requiring frequent cues to keep B hands on RW. Pt able to get R foot up onto step and back down, but suspect some LLE WB despite max cues   Wheelchair Mobility     Tilt Bed    Modified Rankin (Stroke Patients Only)       Balance Overall balance assessment: Needs assistance Sitting-balance support: Feet supported, No upper extremity supported Sitting balance-Leahy Scale: Normal     Standing balance support: Bilateral upper extremity supported, During functional activity, Reliant on assistive device for balance Standing balance-Leahy Scale: Poor Standing balance comment: heavily reliant on RW support to maintain NWB  Communication Communication Communication: No apparent difficulties  Cognition Arousal: Alert Behavior During Therapy: WFL for tasks assessed/performed, Anxious   PT - Cognitive impairments: No apparent impairments                         Following commands: Intact      Cueing Cueing Techniques: Verbal cues  Exercises      General Comments        Pertinent Vitals/Pain Pain Assessment Pain Assessment: Faces Faces  Pain Scale: Hurts little more Pain Location: L foot and B palms with use of RW Pain Descriptors / Indicators: Throbbing Pain Intervention(s): Limited activity within patient's tolerance, Monitored during session, Repositioned     PT Goals (current goals can now be found in the care plan section) Acute Rehab PT Goals Patient Stated Goal: home PT Goal Formulation: With patient Time For Goal Achievement: 07/20/24 Progress towards PT goals: Progressing toward goals    Frequency    Min 3X/week       AM-PAC PT 6 Clicks Mobility   Outcome Measure  Help needed turning from your back to your side while in a flat bed without using bedrails?: None Help needed moving from lying on your back to sitting on the side of a flat bed without using bedrails?: None Help needed moving to and from a bed to a chair (including a wheelchair)?: A Little Help needed standing up from a chair using your arms (e.g., wheelchair or bedside chair)?: A Little Help needed to walk in hospital room?: A Lot Help needed climbing 3-5 steps with a railing? : Total 6 Click Score: 17    End of Session Equipment Utilized During Treatment: Gait belt Activity Tolerance: Other (comment);Patient limited by fatigue (anxiety) Patient left: in bed;with call bell/phone within reach Nurse Communication: Mobility status PT Visit Diagnosis: Unsteadiness on feet (R26.81);Difficulty in walking, not elsewhere classified (R26.2)     Time: 8593-8544 PT Time Calculation (min) (ACUTE ONLY): 49 min  Charges:    $Gait Training: 23-37 mins $Therapeutic Activity: 8-22 mins PT General Charges $$ ACUTE PT VISIT: 1 Visit                Darryle George, PTA Acute Rehabilitation Services Secure Chat Preferred  [phone]    Darryle George 07/09/2024, 3:55 PM

## 2024-07-09 NOTE — Plan of Care (Signed)
" °  Problem: Education: Goal: Ability to describe self-care measures that may prevent or decrease complications (Diabetes Survival Skills Education) will improve Outcome: Progressing   Problem: Coping: Goal: Ability to adjust to condition or change in health will improve Outcome: Progressing   Problem: Nutritional: Goal: Maintenance of adequate nutrition will improve Outcome: Progressing   Problem: Skin Integrity: Goal: Risk for impaired skin integrity will decrease Outcome: Progressing   Problem: Tissue Perfusion: Goal: Adequacy of tissue perfusion will improve Outcome: Progressing   Problem: Education: Goal: Knowledge of General Education information will improve Description: Including pain rating scale, medication(s)/side effects and non-pharmacologic comfort measures Outcome: Progressing   Problem: Activity: Goal: Risk for activity intolerance will decrease Outcome: Progressing   "

## 2024-07-09 NOTE — TOC Progression Note (Signed)
 Transition of Care [facility]) - Progression Note    Patient Details  Name: Patricia Burton MRN: [id number] Date of Birth: 1980-07-27  Transition of Care [facility]) CM/SW Contact  Corean JAYSON Canary, RN Phone Number: 07/09/2024, 3:18 PM  Clinical Narrative:     Jorie Randine Pope to see if the wound vac is approved for charity care. It was approved, RNCM will bring to bedside for possible DC tomorrow IP CM will follow                   Expected Discharge Plan and Services                                               Social Drivers of Health (SDOH) Interventions SDOH Screenings   Food Insecurity: No Food Insecurity (07/03/2024)  Housing: Low Risk (07/03/2024)  Transportation Needs: No Transportation Needs (07/03/2024)  Utilities: Not At Risk (07/03/2024)  Depression (PHQ2-9): Low Risk (01/07/2024)  Tobacco Use: Low Risk (07/06/2024)    Readmission Risk Interventions    07/06/2024    4:07 PM  Readmission Risk Prevention Plan  Post Dischage Appt Complete  Medication Screening Complete  Transportation Screening Complete

## 2024-07-09 NOTE — Progress Notes (Signed)
"  °  Subjective:  Patient ID: Patricia Burton, female    DOB: Oct 11, 1980,  MRN: [id number]  Chief Complaint  Patient presents with   Foot Pain   Wound Infection    DOS: 07/06/24 Procedure: 1.  Extensive soft tissue debridement with partial fifth ray amputation, left foot 2.  Application skin substitute allograft 11.5 x 7 x 1 cm, left foot 3.  Application negative pressure wound therapy 11.5 x 7 x 1 cm, left foot  44 y.o. female seen for post op check.  Patient reports she is doing well.  Denies pain.  We discussed continued nonweightbearing as well as wound VAC changes at home.  Fortunately she has been approved for a home health nurse for home health dressing changes for the wound VAC as well as a charity VAC.  We discussed follow-up plans.  All her questions were answered  Review of Systems: Negative except as noted in the HPI. Denies N/V/F/Ch.   Objective:   Constitutional Well developed. Well nourished.  Vascular Foot warm and well perfused. Capillary refill normal to all digits.   No calf pain with palpation  Neurologic Normal speech. Oriented to person, place, and time. Epicritic sensation diminished to left foot  Dermatologic Wound vac dressing c/d/I and functioning well. 200 mL ss drainage in canister   Orthopedic: S/p Left partial 5th ray amputation, graft and vac application   Radiographs: Surgical changes of partial amputation of the right fifth ray with approximately 3.9 cm of remaining proximal metatarsal. Wound VAC in place.  Pathology: A. TOE, LEFT RAY, AMPUTATION:  Skin ulceration with marked subcutaneous acute inflammation and necrosis.  Acute and chronic osteomyelitis.  Acute inflammation and necrosis extend to the soft tissue margin. No evidence of malignancy.    Micro:   FEW GROUP B STREP(S.AGALACTIAE)ISOLATED TESTING AGAINST S. AGALACTIAE NOT ROUTINELY PERFORMED DUE TO PREDICTABILITY OF AMP/PEN/VAN SUSCEPTIBILITY.    Assessment:   1. Cellulitis of left  lower extremity   2. Type 2 diabetes mellitus with diabetic polyneuropathy, without long-term current use of insulin  (HCC)   Osteomyelitis, abscess, possible necrotizing soft tissue infection left foot s/p serial debridement, partial 5th ray amputation, graft and wound vac LEFT foot  Plan:  Patient was evaluated and treated and all questions answered.  POD # 3 s/p repeat debridement with partial 5th ray amputation, myriad matrix graft, wound vac left foot -Progressing as expected post op, pain controlled.  Continuing with wound VAC therapy.  Wound VAC change tomorrow - Appreciate ID Rec -XR: expected post op changes -WB Status: NWB in post op shoe to left foot -Sutures: remain itnact . -Medications/ABX: continue IV abx pending further culture data, ID recs, group B strep on culture -Dressing: First vac change planned for tomorrow per wound ostomy care nurse. - Appreciate case management team able to secure charity wound VAC as well as charity home health visits.  This will greatly assist in patient's healing of the left foot. - F/u Plan: Patient will be stable for discharge from my standpoint after her wound VAC change tomorrow.  Next VAC change would be next week Tuesday approximately.  She will then follow-up with me in approximately 1 to 2 weeks in the office my office will call to arrange.         Marolyn JULIANNA Honour, DPM [facility]  "

## 2024-07-09 NOTE — Inpatient Diabetes Management (Signed)
 Inpatient Diabetes Program Recommendations  AACE/ADA: New Consensus Statement on Inpatient Glycemic Control (205)  Target Ranges:  Prepandial:   less than 140 mg/dL      Peak postprandial:   less than 180 mg/dL (1-2 hours)      Critically ill patients:  140 - 180 mg/dL   Lab Results  Component Value Date   GLUCAP 155 (H) 07/09/2024   HGBA1C 11.6 (H) 07/03/2024     Inpatient Diabetes Program Recommendations:   Patient was given information yesterday on the [facility] for insulin  assistance after discharge.  She was given [facility] for medication assistance at d/c.   Discharge Recommendations: Other recommendations: Metformin  if appropriate (hopefully can be discharged on basal plus orals at discharge)-Please use TOC pharmacy Long acting recommendations: Insulin  Glargine (BASAGLAR ) Kwikpen dose to be determined at discharge  Supply/Referral recommendations: Glucometer Test strips Lancet device Lancets Pen needles - standard   Use Adult Diabetes Insulin  Treatment Post Discharge order set.  Thanks,  Randall Bullocks, RN, BC-ADM Inpatient Diabetes Coordinator Pager [phone]  (8a-5p)

## 2024-07-09 NOTE — Progress Notes (Signed)
 " PROGRESS NOTE    Patricia Burton  FMW:[id number] DOB: 06-15-1980 DOA: 07/03/2024 PCP: Thedora Garnette HERO, MD    Brief Narrative:  44 year old with non-insulin -dependent diabetes type 2, chronic pain syndrome who was following up at podiatry clinic for left-sided foot infection preceded by injury on the toes, worsening pain and swelling so sent to ER for admission and surgical intervention.  In the emergency room heart rate up to 141, blood glucose 441.  WBC count 12.6.  Lactic acid was normal.  Blood cultures were drawn. CT scan did not show any evidence of bony abnormality but showed soft tissue swelling and possible abscess. Patient underwent debridement 1/31, blood cultures negative.  Surgical cultures with gram-positive cocci. Underwent debridement 2/2, extensive soft tissue debridement and fifth toe ray amputation.  Wound VAC placement.  Subjective:  Patient seen and examined.  No new events.  Blood sugars are well-controlled.  Hopefully home tomorrow after changing wound VAC and inspection of the wound as well as finalizing antibiotic choices.   Assessment & Plan:   Sepsis secondary to diabetic foot infection, left foot cellulitis and abscess: Found to have extensive soft tissue infection. Blood cultures so far. Treated with vancomycin  and Zosyn .  Surgical culture with Streptococcus agalactiae.  Currently on Unasyn . Extensive soft tissue debridement and fifth toe ray amputation Adequate pain medications. Mobilize with postop shoes.  Nonweightbearing as instructed. ID following for antibiotic management.  Hopefully she can go home on oral antibiotics.  Final sensitivity pending. Anticipate home with wound VAC tomorrow.  Type 2 diabetes, uncontrolled with hyperglycemia: Presents with blood glucose of 441. At home on metformin  only. A1c is 11.6.  Inadequately treated.  Starting on long-acting insulin , increasing dose.  Likely home on long-acting insulin  and metformin .  Will prescribe  on discharge.  Patient is comfortable using it.     DVT prophylaxis: enoxaparin  (LOVENOX ) injection 40 mg Start: 07/03/24 2100 SCDs Start: 07/03/24 2015 Place TED hose Start: 07/03/24 2015   Code Status: Full code Family Communication: None at the bedside Disposition Plan: Status is: Inpatient Remains inpatient appropriate because: Immediate postop, IV antibiotics, inpatient procedures     Consultants:  Podiatry ID  Procedures:  I&D and debridement  Antimicrobials:  Vancomycin  and Zosyn  1/30--- 2/3 Unasyn  2/3---     Objective: Vitals:   07/08/24 1923 07/09/24 0001 07/09/24 0439 07/09/24 0858  BP: (!) 144/88 (!) 144/93 (!) 154/95 (!) 157/96  Pulse: 97 98 96 95  Resp: 17 17 17 18   Temp: 99.3 F (37.4 C) 98 F (36.7 C) 98.5 F (36.9 C) 98 F (36.7 C)  TempSrc:      SpO2: 98% 96% 100% 100%  Weight:      Height:        Intake/Output Summary (Last 24 hours) at 07/09/2024 1030 Last data filed at 07/08/2024 1536 Gross per 24 hour  Intake 100 ml  Output --  Net 100 ml   Filed Weights   07/03/24 1018 07/06/24 0921  Weight: 90.7 kg 90.7 kg    Examination:  General: Looks comfortable.  On room air.  Pleasant interactive. Cardiovascular: S1-S2 normal.  Regular rate rhythm.   Respiratory: Bilateral clear.  No added sounds. Gastrointestinal: Soft.  Nontender.  Bowel sound present. Ext: Left foot on immediate postop dressing.  Wound VAC with some serosanguineous drainage.    Data Reviewed: I have personally reviewed following labs and imaging studies  CBC: Recent Labs  Lab 07/03/24 1026 07/04/24 1042 07/05/24 0410  WBC 12.6* 7.9 8.2  NEUTROABS 10.5*  --  5.6  HGB 12.2 9.7* 10.3*  HCT 38.5 30.3* 32.2*  MCV 77.0* 75.8* 75.6*  PLT 390 282 309   Basic Metabolic Panel: Recent Labs  Lab 07/05/24 0410 07/05/24 0930 07/06/24 0313 07/07/24 0513 07/08/24 0255 07/09/24 0745  NA  --  133* 134* 135 136 139  K  --  3.9 3.4* 3.8 4.3 3.8  CL  --  100 97* 101  103 104  CO2  --  22 23 22 25 28   GLUCOSE  --  212* 183* 257* 190* 167*  BUN  --  6 6 7 8  5*  CREATININE  --  0.65 0.63 0.66 0.54 0.49  CALCIUM  --  8.4* 8.1* 8.5* 8.7* 8.9  MG 1.8  --   --   --   --   --   PHOS 2.3*  --   --   --   --   --    GFR: Estimated Creatinine Clearance: 106.5 mL/min (by C-G formula based on SCr of 0.49 mg/dL). Liver Function Tests: Recent Labs  Lab 07/03/24 1026 07/04/24 1042  AST 29 42*  ALT 38 53*  ALKPHOS 133* 100  BILITOT 0.4 0.3  PROT 8.1 6.5  ALBUMIN 3.4* 2.5*   No results for input(s): LIPASE, AMYLASE in the last 168 hours. No results for input(s): AMMONIA in the last 168 hours. Coagulation Profile: No results for input(s): INR, PROTIME in the last 168 hours. Cardiac Enzymes: No results for input(s): CKTOTAL, CKMB, CKMBINDEX, TROPONINI in the last 168 hours. BNP (last 3 results) No results for input(s): PROBNP in the last 8760 hours. HbA1C: No results for input(s): HGBA1C in the last 72 hours.  CBG: Recent Labs  Lab 07/08/24 0835 07/08/24 1132 07/08/24 1606 07/08/24 2110 07/09/24 0856  GLUCAP 137* 170* 200* 177* 155*   Lipid Profile: No results for input(s): CHOL, HDL, LDLCALC, TRIG, CHOLHDL, LDLDIRECT in the last 72 hours. Thyroid Function Tests: No results for input(s): TSH, T4TOTAL, FREET4, T3FREE, THYROIDAB in the last 72 hours.  Anemia Panel: No results for input(s): VITAMINB12, FOLATE, FERRITIN, TIBC, IRON, RETICCTPCT in the last 72 hours. Sepsis Labs: Recent Labs  Lab 07/03/24 1049 07/03/24 1759  LATICACIDVEN 1.2 1.4    Recent Results (from the past 240 hours)  Blood culture (routine x 2)     Status: None   Collection Time: 07/03/24  5:35 PM   Specimen: BLOOD  Result Value Ref Range Status   Specimen Description BLOOD RIGHT ANTECUBITAL  Final   Special Requests   Final    BOTTLES DRAWN AEROBIC AND ANAEROBIC Blood Culture results may not be optimal due to  an inadequate volume of blood received in culture bottles   Culture   Final    NO GROWTH 5 DAYS Performed at [facility], [address] [place], [place] [address]    Report Status 07/08/2024 FINAL  Final  Blood culture (routine x 2)     Status: None   Collection Time: 07/03/24  8:16 PM   Specimen: BLOOD  Result Value Ref Range Status   Specimen Description BLOOD LEFT ANTECUBITAL  Final   Special Requests   Final    BOTTLES DRAWN AEROBIC AND ANAEROBIC Blood Culture adequate volume   Culture   Final    NO GROWTH 5 DAYS Performed at [facility], [address] [place], [place] [address]    Report Status 07/08/2024 FINAL  Final  Aerobic/Anaerobic Culture w Gram Stain (  surgical/deep wound)     Status: None (Preliminary result)   Collection Time: 07/04/24  8:41 AM   Specimen: Soft Tissue, Other  Result Value Ref Range Status   Specimen Description TISSUE  Final   Special Requests CULTURES OF LEFT FOOT  Final   Gram Stain   Final    RARE SQUAMOUS EPITHELIAL CELLS PRESENT WBC PRESENT, PREDOMINANTLY PMN ABUNDANT GRAM POSITIVE COCCI Performed at [facility], [address], [place], [place] [address]    Culture   Final    MODERATE GROUP B STREP(S.AGALACTIAE)ISOLATED TESTING AGAINST S. AGALACTIAE NOT ROUTINELY PERFORMED DUE TO PREDICTABILITY OF AMP/PEN/VAN SUSCEPTIBILITY. NO ANAEROBES ISOLATED; CULTURE IN PROGRESS FOR 5 DAYS    Report Status PENDING  Incomplete  Aerobic/Anaerobic Culture w Gram Stain (surgical/deep wound)     Status: None (Preliminary result)   Collection Time: 07/06/24 10:20 AM   Specimen: Foot, Left; Tissue  Result Value Ref Range Status   Specimen Description TISSUE  Final   Special Requests LEFT FOOT  Final   Gram Stain   Final    NO WBC SEEN RARE GRAM POSITIVE COCCI IN PAIRS Performed at [facility], [address], [place], [place] [address]    Culture   Final    FEW GROUP B STREP(S.AGALACTIAE)ISOLATED TESTING AGAINST S.  AGALACTIAE NOT ROUTINELY PERFORMED DUE TO PREDICTABILITY OF AMP/PEN/VAN SUSCEPTIBILITY. NO ANAEROBES ISOLATED; CULTURE IN PROGRESS FOR 5 DAYS    Report Status PENDING  Incomplete         Radiology Studies: No results found.       Scheduled Meds:  (feeding supplement) PROSource Plus  30 mL Oral BID BM   ascorbic acid   500 mg Oral Daily   enoxaparin  (LOVENOX ) injection  40 mg Subcutaneous Q24H   feeding supplement (GLUCERNA SHAKE)  237 mL Oral BID BM   insulin  aspart  0-5 Units Subcutaneous QHS   insulin  aspart  0-9 Units Subcutaneous TID WC   insulin  glargine-yfgn  24 Units Subcutaneous Daily   insulin  starter kit- pen needles  1 kit Other Once   living well with diabetes book   Does not apply Once   metFORMIN   850 mg Oral BID WC   multivitamin with minerals  1 tablet Oral Daily   sodium chloride  flush  3 mL Intravenous Q12H   zinc  sulfate (50mg  elemental zinc )  220 mg Oral Daily   Continuous Infusions:  ampicillin -sulbactam (UNASYN ) IV 3 g (07/09/24 0917)     LOS: 6 days       Renato Applebaum, MD [facility]   "

## 2024-07-10 ENCOUNTER — Encounter (HOSPITAL_COMMUNITY): Payer: Self-pay | Admitting: Podiatry

## 2024-07-10 ENCOUNTER — Other Ambulatory Visit: Payer: Self-pay

## 2024-07-10 ENCOUNTER — Telehealth: Payer: Self-pay | Admitting: Podiatry

## 2024-07-10 ENCOUNTER — Other Ambulatory Visit (HOSPITAL_COMMUNITY): Payer: Self-pay

## 2024-07-10 DIAGNOSIS — A491 Streptococcal infection, unspecified site: Secondary | ICD-10-CM

## 2024-07-10 DIAGNOSIS — E11621 Type 2 diabetes mellitus with foot ulcer: Secondary | ICD-10-CM

## 2024-07-10 LAB — BASIC METABOLIC PANEL WITH GFR
Anion gap: 9 (ref 5–15)
BUN: 7 mg/dL (ref 6–20)
CO2: 26 mmol/L (ref 22–32)
Calcium: 8.7 mg/dL — ABNORMAL LOW (ref 8.9–10.3)
Chloride: 101 mmol/L (ref 98–111)
Creatinine, Ser: 0.53 mg/dL (ref 0.44–1.00)
GFR, Estimated: >60 mL/min
Glucose, Bld: 143 mg/dL — ABNORMAL HIGH (ref 70–99)
Potassium: 3.8 mmol/L (ref 3.5–5.1)
Sodium: 136 mmol/L (ref 135–145)

## 2024-07-10 LAB — AEROBIC/ANAEROBIC CULTURE W GRAM STAIN (SURGICAL/DEEP WOUND)

## 2024-07-10 LAB — GLUCOSE, CAPILLARY
Glucose-Capillary: 140 mg/dL — ABNORMAL HIGH (ref 70–99)
Glucose-Capillary: 137 mg/dL — ABNORMAL HIGH (ref 70–99)
Glucose-Capillary: 145 mg/dL — ABNORMAL HIGH (ref 70–99)
Glucose-Capillary: 159 mg/dL — ABNORMAL HIGH (ref 70–99)

## 2024-07-10 MED ORDER — AMOXICILLIN-POT CLAVULANATE 875-125 MG PO TABS
1.0000 | ORAL_TABLET | Freq: Two times a day (BID) | ORAL | 0 refills | Status: AC
  Filled 2024-07-10: qty 28, 14d supply, fill #0

## 2024-07-10 MED ORDER — CEFAZOLIN IV (FOR PTA / DISCHARGE USE ONLY): 2.0000 g | Freq: Three times a day (TID) | INTRAVENOUS | 0 refills | Status: AC

## 2024-07-10 MED ORDER — SODIUM CHLORIDE 0.9% FLUSH: 10.0000 mL | INTRAVENOUS | Status: AC | PRN

## 2024-07-10 MED ORDER — CHLORHEXIDINE GLUCONATE CLOTH 2 % EX PADS
6.0000 | MEDICATED_PAD | Freq: Every day | CUTANEOUS | Status: AC
  Administered 2024-07-10: 6 via TOPICAL

## 2024-07-10 MED ORDER — CEFAZOLIN SODIUM-DEXTROSE 2-4 GM/100ML-% IV SOLN
2.0000 g | Freq: Three times a day (TID) | INTRAVENOUS | Status: AC
  Administered 2024-07-10 (×2): 2 g via INTRAVENOUS
  Filled 2024-07-10 (×2): qty 100

## 2024-07-10 NOTE — Telephone Encounter (Signed)
 Called and left voicemail for patient to call and schedule surgery follow up with Dr. Malvin for next week.

## 2024-07-10 NOTE — Progress Notes (Signed)
 Peripherally Inserted Central Catheter Placement  The IV Nurse has discussed with the patient and/or persons authorized to consent for the patient, the purpose of this procedure and the potential benefits and risks involved with this procedure.  The benefits include less needle sticks, lab draws from the catheter, and the patient may be discharged home with the catheter. Risks include, but not limited to, infection, bleeding, blood clot (thrombus formation), and puncture of an artery; nerve damage and irregular heartbeat and possibility to perform a PICC exchange if needed/ordered by physician.  Alternatives to this procedure were also discussed.  Bard Power PICC patient education guide, fact sheet on infection prevention and patient information card has been provided to patient /or left at bedside.    PICC Placement Documentation  PICC Single Lumen 07/10/24 Right Basilic 44 cm 2 cm (Active)  Indication for Insertion or Continuance of Line Home intravenous therapies 07/10/24 1310  Exposed Catheter (cm) 2 cm 07/10/24 1310  Site Assessment Clean, Dry, Intact 07/10/24 1310  Line Status Flushed;Saline locked;Blood return noted 07/10/24 1310  Dressing Type Transparent;Securing device 07/10/24 1310  Dressing Status Antimicrobial disc/dressing in place;Clean, Dry, Intact 07/10/24 1310  Line Care Connections checked and tightened 07/10/24 1310  Line Adjustment (NICU/IV Team Only) No 07/10/24 1310  Dressing Intervention New dressing;Adhesive placed at insertion site (IV team only) 07/10/24 1310  Dressing Change Due 07/17/24 07/10/24 1310       Renaee Notice Albarece 07/10/2024, 1:11 PM

## 2024-07-10 NOTE — Progress Notes (Signed)
 "   [facility]    Date of Admission:  07/03/2024   Total days of antibiotics 8   ID: Patricia Burton is a 44 y.o. 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 Has several questions about abtx administration and picc line  Cx from 2/2 showing group b strep  Medications:   (feeding supplement) PROSource Plus  30 mL Oral BID BM   ascorbic acid   500 mg Oral Daily   Chlorhexidine  Gluconate Cloth  6 each Topical Daily   enoxaparin  (LOVENOX ) injection  40 mg Subcutaneous Q24H   feeding supplement (GLUCERNA SHAKE)  237 mL Oral BID BM   insulin  aspart  0-5 Units Subcutaneous QHS   insulin  aspart  0-9 Units Subcutaneous TID WC   insulin  glargine-yfgn  24 Units Subcutaneous Daily   insulin  starter kit- pen needles  1 kit Other Once   living well with diabetes book   Does not apply Once   multivitamin with minerals  1 tablet Oral Daily   sodium chloride  flush  3 mL Intravenous Q12H   zinc  sulfate (50mg  elemental zinc )  220 mg Oral Daily    Objective: Vital signs in last 24 hours: Temp:  [98 F (36.7 C)-98.6 F (37 C)] 98.6 F (37 C) (02/06 0425) Pulse Rate:  [89-102] 101 (02/06 1655) Resp:  [18-20] 18 (02/06 1655) BP: (134-160)/(85-95) 152/92 (02/06 1655) SpO2:  [96 %-99 %] 97 % (02/06 1655)  Physical Exam  Constitutional:  oriented to person, place, and time. appears well-developed and well-nourished. No distress.  HENT:  /AT, PERRLA, no scleral icterus Mouth/Throat: Oropharynx is clear and moist. No oropharyngeal exudate.  Cardiovascular: Normal rate, regular rhythm and normal heart sounds. Exam reveals no gallop and no friction rub.  No murmur heard.  Pulmonary/Chest: Effort normal and breath sounds normal. No respiratory distress.  has no wheezes.  Neck = supple, no nuchal rigidity Zku:ozqu foot with wound vac and dressing Neurological: alert and oriented to person, place, and time.  Skin: Skin is warm and dry. No rash noted. No erythema.  Psychiatric: a normal mood and affect.  behavior is normal.    Lab Results Recent Labs    07/09/24 0745 07/10/24 0441  NA 139 136  K 3.8 3.8  CL 104 101  CO2 28 26  BUN 5* 7  CREATININE 0.49 0.53   Lab Results  Component Value Date   ESRSEDRATE 113 (H) 07/03/2024    Microbiology: RARE GRAM POSITIVE COCCI IN PAIRS Performed at [facility], [address] [place], [place] [address]   Culture FEW GROUP B STREP(S.AGALACTIAE)ISOLATED TESTING AGAINST S. AGALACTIAE NOT ROUTINELY PERFORMED DUE TO PREDICTABILITY OF AMP/PEN/VAN SUSCEPTIBILITY. NO ANAEROBES ISOLATED; CULTURE IN PROGRESS FOR 5 DAYS   Studies/Results: US  EKG SITE RITE Result Date: 07/10/2024 If Site Rite image not attached, placement could not be confirmed due to current cardiac rhythm.  Path: A.  TOE, LEFT RAY, AMPUTATION:  Skin ulceration with marked subcutaneous acute inflammation and  necrosis.  Acute and chronic osteomyelitis.  Acute inflammation and necrosis extend to the soft tissue margin.  No evidence of malignancy.   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 We will see her back on 3/2 to see if further abtx  needed.  Coordination with home health, attending and pharmacy team   Diagnosis: DF osteo  Culture Result: group b strep  Allergies[1]  OPAT Orders Discharge antibiotics to be given via PICC line Discharge antibiotics: Per pharmacy protocol cefazolin  2gm IV Q8hr Duration: 2 wk End Date: 2/16  [facility] Care Per Protocol:  Home health RN for IV administration and teaching; PICC line care and labs.    Labs weekly while on IV antibiotics: __x CBC with differential _x_ BMP _x_ CRP _x_ ESR  x __ Please pull PIC at completion of IV antibiotics   Fax weekly labs to [phone]  Clinic Follow Up Appt: 3/2  evaluation of this patient requires complex antimicrobial therapy evaluation and counseling and isolation needs for disease transmission risk assessment and mitigation.   I have personally spent 35 minutes involved in face-to-face and non-face-to-face activities for this patient on the day of the visit. Professional time spent includes the following activities: Preparing to see the patient (review of tests), Obtaining and/or reviewing separately obtained history (admission/discharge record), Performing a medically appropriate examination and/or evaluation , Ordering medications/tests/procedures, referring and communicating with other health care professionals, Documenting clinical information in the EMR, Independently interpreting results (not separately reported), Communicating results to the patient/family/caregiver, Counseling and educating the patient/family/caregiver and Care coordination (not separately reported).   Montie FURY Luiz MD MPH [facility] [phone]   07/10/2024, 5:19 PM         [1]  Allergies Allergen Reactions   Other Anaphylaxis and Itching    epidural   Latex Other (See Comments) and Swelling    Other reaction(s): Generalized Edema (intolerance)   Pollen Extract Other (See Comments)    Red eyes, itchy throat    Sulfa Antibiotics Other (See Comments)    Unknown   "

## 2024-07-10 NOTE — Progress Notes (Signed)
 Physical Therapy Treatment Patient Details Name: Patricia Burton MRN: [id number] DOB: 19-Oct-1980 Today's Date: 07/10/2024   History of Present Illness Patricia Burton is a 44 y.o. female s/p partial L th ray amputation. Wound vac application due to necrotizing soft tissue infection of L foot and osteomyelitis in distal 5th ray. PMH: non-insulin -dependent DM type II, chronic pain syndrome    PT Comments  Pt resting in bed, agreeable to therapy session. Provided handout regarding stair negotiation in a WC, also recommended pt look up videos on YouTube for example of how to complete task. Introduced knee scooter to pt, demonstrated proper use and features to ensure it is comfortable to her.  Practiced transitioning onto scooter and taking some steps in the room. Discussed management of wound vac bag and line to ensure safety while mobilizing on device. Pt initially nervous however with practice enjoyed using knee scooter and feels it could be helpful at home. PT and pt discussed ways for her to get a knee scooter. Pt motivated to return home and continue with skilled therapy at [facility].    If plan is discharge home, recommend the following: A little help with walking and/or transfers;A little help with bathing/dressing/bathroom;Assist for transportation;Help with stairs or ramp for entrance   Can travel by private vehicle        Equipment Recommendations  Wheelchair (measurements PT);Wheelchair cushion (measurements PT);Other (comment) (knee scooter)    Recommendations for Other Services       Precautions / Restrictions Precautions Precautions: Fall Required Braces or Orthoses: Other Brace Other Brace: L post op shoe - in room Restrictions Weight Bearing Restrictions Per Provider Order: Yes LLE Weight Bearing Per Provider Order: Non weight bearing     Mobility  Bed Mobility Overal bed mobility: Modified Independent             General bed mobility comments: HOB elevated, pt able to  bring self to EOB and return self to supine in bed without physical assist, increased time    Transfers Overall transfer level: Needs assistance Equipment used: Rolling walker (2 wheels) Transfers: Sit to/from Stand Sit to Stand: Contact guard assist, Min assist           General transfer comment: completed x3 from EOB with RW for x2 and knee scooter x1; increased assistance needed with knee scooter to stabilize device and assist pt sequence transition with knee scooter psoitioned between her knees    Ambulation/Gait Ambulation/Gait assistance: Min assist Gait Distance (Feet): 5 Feet Assistive device:  (knee scooter)   Gait velocity: dec     General Gait Details: took forwards/backwards steps on knee scooter at MIN A, pt apprehnsive of device initially but enjoyed using it once more comfortable. Problem solved management of wound vac bag and line when using knee scooter   Stairs             Wheelchair Mobility     Tilt Bed    Modified Rankin (Stroke Patients Only)       Balance Overall balance assessment: Needs assistance Sitting-balance support: Feet supported, No upper extremity supported Sitting balance-Leahy Scale: Normal     Standing balance support: Bilateral upper extremity supported, During functional activity, Reliant on assistive device for balance Standing balance-Leahy Scale: Poor Standing balance comment: heavily reliant on RW or knee scooter support to maintain NWB with additional assistance from PT to maintain standing balance  Communication Communication Communication: No apparent difficulties  Cognition Arousal: Alert Behavior During Therapy: WFL for tasks assessed/performed, Anxious   PT - Cognitive impairments: No apparent impairments                         Following commands: Intact      Cueing Cueing Techniques: Verbal cues  Exercises      General Comments        Pertinent  Vitals/Pain Pain Assessment Pain Assessment: Faces Faces Pain Scale: Hurts a little bit Pain Location: L foot Pain Descriptors / Indicators: Throbbing Pain Intervention(s): Limited activity within patient's tolerance, Monitored during session, Repositioned    Home Living                          Prior Function            PT Goals (current goals can now be found in the care plan section) Acute Rehab PT Goals Patient Stated Goal: home PT Goal Formulation: With patient Time For Goal Achievement: 07/20/24 Potential to Achieve Goals: Good Progress towards PT goals: Progressing toward goals    Frequency    Min 3X/week      PT Plan      Co-evaluation              AM-PAC PT 6 Clicks Mobility   Outcome Measure  Help needed turning from your back to your side while in a flat bed without using bedrails?: None Help needed moving from lying on your back to sitting on the side of a flat bed without using bedrails?: None Help needed moving to and from a bed to a chair (including a wheelchair)?: A Little Help needed standing up from a chair using your arms (e.g., wheelchair or bedside chair)?: A Little Help needed to walk in hospital room?: A Lot Help needed climbing 3-5 steps with a railing? : Total 6 Click Score: 17    End of Session Equipment Utilized During Treatment: Gait belt;Other (comment) (post-op shoe L) Activity Tolerance: Patient tolerated treatment well Patient left: in bed;with call bell/phone within reach Nurse Communication: Mobility status PT Visit Diagnosis: Unsteadiness on feet (R26.81);Difficulty in walking, not elsewhere classified (R26.2)     Time: 8643-8564 PT Time Calculation (min) (ACUTE ONLY): 39 min  Charges:    $Gait Training: 8-22 mins $Therapeutic Activity: 23-37 mins                       Jamilynn Whitacre H. Odessia Asleson, PT, DPT   [employer] 07/10/2024, 3:21 PM

## 2024-07-10 NOTE — Care Management (Signed)
" °  °  Durable Medical Equipment  (From admission, onward)           Start     Ordered   07/10/24 1601  For home use only DME standard manual wheelchair with seat cushion  Once       Comments: Patient suffers from Foot infection which impairs their ability to perform daily activities like toileting in the home.  A walker will not resolve issue with performing activities of daily living. A wheelchair will allow patient to safely perform daily activities. Patient can safely propel the wheelchair in the home or has a caregiver who can provide assistance. Length of need 6 months . Accessories: elevating leg rests (ELRs), wheel locks, extensions and anti-tippers.   07/10/24 1601            "

## 2024-07-10 NOTE — Progress Notes (Signed)
"  °  Subjective:  Patient ID: Patricia Burton, female    DOB: 1980-08-20,  MRN: [id number]  Chief Complaint  Patient presents with   Foot Pain   Wound Infection    DOS: 07/06/24 Procedure: 1.  Extensive soft tissue debridement with partial fifth ray amputation, left foot 2.  Application skin substitute allograft 11.5 x 7 x 1 cm, left foot 3.  Application negative pressure wound therapy 11.5 x 7 x 1 cm, left foot  44 y.o. female seen for post op check.  Discussed follow up plans. She had no questions or concerns.   Review of Systems: Negative except as noted in the HPI. Denies N/V/F/Ch.   Objective:   Constitutional Well developed. Well nourished.  Vascular Foot warm and well perfused. Capillary refill normal to all digits.   No calf pain with palpation  Neurologic Normal speech. Oriented to person, place, and time. Epicritic sensation diminished to left foot  Dermatologic Wound vac dressing c/d/I and functioning well.  Sorbac intact on foot, granular tissue infill underlying   Orthopedic: S/p Left partial 5th ray amputation, graft and vac application   Radiographs: Surgical changes of partial amputation of the right fifth ray with approximately 3.9 cm of remaining proximal metatarsal. Wound VAC in place.  Pathology: A. TOE, LEFT RAY, AMPUTATION:  Skin ulceration with marked subcutaneous acute inflammation and necrosis.  Acute and chronic osteomyelitis.  Acute inflammation and necrosis extend to the soft tissue margin. No evidence of malignancy.    Micro:   FEW GROUP B STREP(S.AGALACTIAE)ISOLATED TESTING AGAINST S. AGALACTIAE NOT ROUTINELY PERFORMED DUE TO PREDICTABILITY OF AMP/PEN/VAN SUSCEPTIBILITY.    Assessment:   1. Cellulitis of left lower extremity   2. Type 2 diabetes mellitus with diabetic polyneuropathy, without long-term current use of insulin  (HCC)   Osteomyelitis, abscess, possible necrotizing soft tissue infection left foot s/p serial debridement, partial  5th ray amputation, graft and wound vac LEFT foot  Plan:  Patient was evaluated and treated and all questions answered.  POD # 4 s/p repeat debridement with partial 5th ray amputation, myriad matrix graft, wound vac left foot -Progressing as expected post op, pain controlled.  Vac changed today per WOCN, looked good per RN, appreciate assistance with vac as always.  - Appreciate ID Rec, abx per ID -XR: expected post op changes -WB Status: NWB in post op shoe to left foot -Sutures: remain itnact . -Medications/ABX: continue IV abx pending further culture data, ID recs, group B strep on culture -Dressing: 2x weekly dressing change with vac to L foot with home health, next change next week Tuesday, then tues/fri - Appreciate case management team able to secure charity wound VAC as well as charity home health visits.  This will greatly assist in patient's healing of the left foot. - F/u Plan: Patient stable for dc from my standpoint. Next VAC change would be next week Tuesday approximately.  She will then follow-up with me in approximately 1 to 2 weeks in the office my office will call to arrange. Will sign off        Marolyn JULIANNA Honour, DPM [facility] / [facility]  "

## 2024-07-10 NOTE — Progress Notes (Signed)
 " PROGRESS NOTE    Patricia Burton  FMW:[id number] DOB: [birth year] DOA: 07/03/2024 PCP: Thedora Garnette HERO, MD    Brief Narrative:  44 year old with non-insulin -dependent diabetes type 2, chronic pain syndrome who was following up at podiatry clinic for left-sided foot infection preceded by injury on the toes, worsening pain and swelling so sent to ER for admission and surgical intervention.  In the emergency room heart rate up to 141, blood glucose 441.  WBC count 12.6.  Lactic acid was normal.  Blood cultures were drawn. CT scan did not show any evidence of bony abnormality but showed soft tissue swelling and possible abscess. Patient underwent debridement 1/31, blood cultures negative.  Surgical cultures with gram-positive cocci. Underwent debridement 2/2, extensive soft tissue debridement and fifth toe ray amputation.  Wound VAC placement.  Subjective:  Patient was seen and examined in the morning rounds.  No overnight events.  She will be attempting to work for his stairs today with physical therapy. Patient agreed to PICC line.  Discussed with podiatry and infectious disease, planning to discharge home with portable wound VAC and PICC line and cefazolin  until 3/3.  PICC line, antibiotics could not be arranged today so hopefully discharge home tomorrow morning.   Assessment & Plan:   Sepsis secondary to diabetic foot infection, left foot cellulitis and abscess: Found to have extensive soft tissue infection. Blood cultures so far. Treated with vancomycin  and Zosyn .  Surgical culture with Streptococcus agalactiae.  Currently on Unasyn . Extensive soft tissue debridement and fifth toe ray amputation Adequate pain medications. Mobilize with postop shoes.  Nonweightbearing as instructed. ID following for antibiotic management.   Wound VAC, portable arranged.  Home health arranged. PICC line ordered.  Planning to discharge with cefazolin .  Type 2 diabetes, uncontrolled with  hyperglycemia: Presents with blood glucose of 441. At home on metformin  only. A1c is 11.6.  Inadequately treated.  Starting on long-acting insulin . She does not like metformin . Will discharge home on long-acting insulin  and Januvia 25 mg daily. Depends upon her insulin  need, will need prescription on discharge.     DVT prophylaxis: enoxaparin  (LOVENOX ) injection 40 mg Start: 07/03/24 2100 SCDs Start: 07/03/24 2015 Place TED hose Start: 07/03/24 2015   Code Status: Full code Family Communication: None at the bedside Disposition Plan: Status is: Inpatient Remains inpatient appropriate because: Immediate postop, IV antibiotics, inpatient procedures     Consultants:  Podiatry ID  Procedures:  I&D and debridement  Antimicrobials:  Vancomycin  and Zosyn  1/30--- 2/3 Unasyn  2/3--- 2/6 Cefazolin  2/6--      Objective: Vitals:   07/10/24 0055 07/10/24 0425 07/10/24 0848 07/10/24 1221  BP: 134/85 (!) 145/90 (!) 145/93 (!) 160/95  Pulse: 93 91 98 (!) 102  Resp: 18 20 18 18   Temp: 98.5 F (36.9 C) 98.6 F (37 C)    TempSrc:      SpO2: 96% 97% 98% 99%  Weight:      Height:        Intake/Output Summary (Last 24 hours) at 07/10/2024 1259 Last data filed at 07/10/2024 0900 Gross per 24 hour  Intake 700 ml  Output 50 ml  Net 650 ml   Filed Weights   07/03/24 1018 07/06/24 0921  Weight: 90.7 kg 90.7 kg    Examination:  General: Looks comfortable.  On room air.  Pleasant interactive. Cardiovascular: S1-S2 normal.  Regular rate rhythm.   Respiratory: Bilateral clear.  No added sounds. Gastrointestinal: Soft.  Nontender.  Bowel sound present. Ext: Left foot on  immediate postop dressing.  Wound VAC with some serosanguineous drainage.    Data Reviewed: I have personally reviewed following labs and imaging studies  CBC: Recent Labs  Lab 07/04/24 1042 07/05/24 0410  WBC 7.9 8.2  NEUTROABS  --  5.6  HGB 9.7* 10.3*  HCT 30.3* 32.2*  MCV 75.8* 75.6*  PLT 282 309    Basic Metabolic Panel: Recent Labs  Lab 07/05/24 0410 07/05/24 0930 07/06/24 0313 07/07/24 0513 07/08/24 0255 07/09/24 0745 07/10/24 0441  NA  --    < > 134* 135 136 139 136  K  --    < > 3.4* 3.8 4.3 3.8 3.8  CL  --    < > 97* 101 103 104 101  CO2  --    < > 23 22 25 28 26   GLUCOSE  --    < > 183* 257* 190* 167* 143*  BUN  --    < > 6 7 8  5* 7  CREATININE  --    < > 0.63 0.66 0.54 0.49 0.53  CALCIUM  --    < > 8.1* 8.5* 8.7* 8.9 8.7*  MG 1.8  --   --   --   --   --   --   PHOS 2.3*  --   --   --   --   --   --    < > = values in this interval not displayed.   GFR: Estimated Creatinine Clearance: 106.5 mL/min (by C-G formula based on SCr of 0.53 mg/dL). Liver Function Tests: Recent Labs  Lab 07/04/24 1042  AST 42*  ALT 53*  ALKPHOS 100  BILITOT 0.3  PROT 6.5  ALBUMIN 2.5*   No results for input(s): LIPASE, AMYLASE in the last 168 hours. No results for input(s): AMMONIA in the last 168 hours. Coagulation Profile: No results for input(s): INR, PROTIME in the last 168 hours. Cardiac Enzymes: No results for input(s): CKTOTAL, CKMB, CKMBINDEX, TROPONINI in the last 168 hours. BNP (last 3 results) No results for input(s): PROBNP in the last 8760 hours. HbA1C: No results for input(s): HGBA1C in the last 72 hours.  CBG: Recent Labs  Lab 07/09/24 1229 07/09/24 1609 07/09/24 1956 07/10/24 0847 07/10/24 1220  GLUCAP 208* 159* 133* 140* 137*   Lipid Profile: No results for input(s): CHOL, HDL, LDLCALC, TRIG, CHOLHDL, LDLDIRECT in the last 72 hours. Thyroid Function Tests: No results for input(s): TSH, T4TOTAL, FREET4, T3FREE, THYROIDAB in the last 72 hours.  Anemia Panel: No results for input(s): VITAMINB12, FOLATE, FERRITIN, TIBC, IRON, RETICCTPCT in the last 72 hours. Sepsis Labs: Recent Labs  Lab 07/03/24 1759  LATICACIDVEN 1.4    Recent Results (from the past 240 hours)  Blood culture (routine x  2)     Status: None   Collection Time: 07/03/24  5:35 PM   Specimen: BLOOD  Result Value Ref Range Status   Specimen Description BLOOD RIGHT ANTECUBITAL  Final   Special Requests   Final    BOTTLES DRAWN AEROBIC AND ANAEROBIC Blood Culture results may not be optimal due to an inadequate volume of blood received in culture bottles   Culture   Final    NO GROWTH 5 DAYS Performed at [facility], [address] [place], [place] [address]    Report Status 07/08/2024 FINAL  Final  Blood culture (routine x 2)     Status: None   Collection Time: 07/03/24  8:16 PM   Specimen: BLOOD  Result Value Ref Range Status   Specimen Description BLOOD LEFT ANTECUBITAL  Final   Special Requests   Final    BOTTLES DRAWN AEROBIC AND ANAEROBIC Blood Culture adequate volume   Culture   Final    NO GROWTH 5 DAYS Performed at [facility], [address] [place], [place] [address]    Report Status 07/08/2024 FINAL  Final  Aerobic/Anaerobic Culture w Gram Stain (surgical/deep wound)     Status: None (Preliminary result)   Collection Time: 07/04/24  8:41 AM   Specimen: Soft Tissue, Other  Result Value Ref Range Status   Specimen Description TISSUE  Final   Special Requests CULTURES OF LEFT FOOT  Final   Gram Stain   Final    RARE SQUAMOUS EPITHELIAL CELLS PRESENT WBC PRESENT, PREDOMINANTLY PMN ABUNDANT GRAM POSITIVE COCCI Performed at [facility], [address], [place], [place] [address]    Culture   Final    MODERATE GROUP B STREP(S.AGALACTIAE)ISOLATED TESTING AGAINST S. AGALACTIAE NOT ROUTINELY PERFORMED DUE TO PREDICTABILITY OF AMP/PEN/VAN SUSCEPTIBILITY. NO ANAEROBES ISOLATED; CULTURE IN PROGRESS FOR 5 DAYS    Report Status PENDING  Incomplete  Aerobic/Anaerobic Culture w Gram Stain (surgical/deep wound)     Status: None (Preliminary result)   Collection Time: 07/06/24 10:20 AM   Specimen: Foot, Left; Tissue  Result Value Ref Range Status   Specimen Description TISSUE   Final   Special Requests LEFT FOOT  Final   Gram Stain   Final    NO WBC SEEN RARE GRAM POSITIVE COCCI IN PAIRS Performed at [facility], [address] [place], [place] [address]    Culture   Final    FEW GROUP B STREP(S.AGALACTIAE)ISOLATED TESTING AGAINST S. AGALACTIAE NOT ROUTINELY PERFORMED DUE TO PREDICTABILITY OF AMP/PEN/VAN SUSCEPTIBILITY. NO ANAEROBES ISOLATED; CULTURE IN PROGRESS FOR 5 DAYS    Report Status PENDING  Incomplete         Radiology Studies: US  EKG SITE RITE Result Date: 07/10/2024 If Site Rite image not attached, placement could not be confirmed due to current cardiac rhythm.        Scheduled Meds:  (feeding supplement) PROSource Plus  30 mL Oral BID BM   ascorbic acid   500 mg Oral Daily   enoxaparin  (LOVENOX ) injection  40 mg Subcutaneous Q24H   feeding supplement (GLUCERNA SHAKE)  237 mL Oral BID BM   insulin  aspart  0-5 Units Subcutaneous QHS   insulin  aspart  0-9 Units Subcutaneous TID WC   insulin  glargine-yfgn  24 Units Subcutaneous Daily   insulin  starter kit- pen needles  1 kit Other Once   living well with diabetes book   Does not apply Once   metFORMIN   850 mg Oral BID WC   multivitamin with minerals  1 tablet Oral Daily   sodium chloride  flush  3 mL Intravenous Q12H   zinc  sulfate (50mg  elemental zinc )  220 mg Oral Daily   Continuous Infusions:   ceFAZolin  (ANCEF ) IV       LOS: 7 days       Renato Applebaum, MD [facility]   "

## 2024-07-10 NOTE — Progress Notes (Addendum)
 PHARMACY CONSULT NOTE FOR:  OUTPATIENT  PARENTERAL ANTIBIOTIC THERAPY (OPAT)  Indication: Left foot osteomyelitis  Regimen: cefazolin  2g IV q8h  End date: 07/20/24  After completion of IV antibiotics, will transition to Augmentin  for an additional 2 weeks to bridge to ID appointment  IV antibiotic discharge orders are pended. To discharging provider:  please sign these orders via discharge navigator,  Select New Orders & click on the button choice - Manage This Unsigned Work.     Thank you for allowing pharmacy to be a part of this patients care.  Almarie Lunger, PharmD, BCPS, BCIDP Infectious Diseases Clinical Pharmacist 07/10/2024 1:09 PM   **Pharmacist phone directory can now be found on [email or web address] ([email or web address]).  Listed under [facility].

## 2024-07-10 NOTE — Consult Note (Signed)
" ° ° °  CLINICAL SUPPORT TEAM - WOUND OSTOMY AND CONTINENCE TEAM  CONSULTATION SERVICES   WOC Nurse-Inpatient Note   WOC Nurse Consult Note: Reason for Consult: Consult requested to perform Vac dressing change to left foot.  Pt was medicated for pain prior to the procedure and tolerated with minimal amt discomfort.  Left anterior and outer foot with full thickness post-op wound; green Sorbact dressing stapled in place over skin substitute graft, unable to assess. Red moist edges visible, small amt pink drainage in the cannister. 11X8cm.      Applied one piece black sponge to 125mm cont suction over intact dressing.  Applied barrier strip to the edges near the toes to attempt to maintain a seal. Applied ace wrap over the Vac dressing. Pt plans to discharge today.  Attached to home Vac which was at the bedside. Plans to have home health change the dressing twice a week.   Please re-consult if further assistance is needed.  Thank-you,  Stephane Fought MSN, RN, CWOCN, CWCN-AP, CNS Contact Mon-Fri 0700-1500: [phone]     "

## 2024-07-10 NOTE — TOC Progression Note (Addendum)
 Transition of Care [facility]) - Progression Note    Patient Details  Name: Patricia Burton MRN: [id number] Date of Birth: [birth year]  Transition of Care [facility]) CM/SW Contact  Corean JAYSON Canary, RN Phone Number: 07/10/2024, 11:06 AM  Clinical Narrative:    Beatris with Holley Herring with [employer] Will need IV abx and PICC line. Cost for self pay Cefazolin  three times a day 42.08/day,  Pam will call patient to discuss. She is getting her PICC ine today and can be ready for DC.  1205 Pam spoke to paitent nad she will work out a insurance claims handler for IV ABX Dr Luiz to put in  OPAT medications until 3/3. Will update [facility] 1230 Pam will come to educate patient/family this evening.  They will work out a higher education careers adviser for payments with the patient. Plan to DC in AM 1250 Spoke to Zillah at [employer], They were unaware of addition of IV abx ,  they cannot take IV abx and wound vac charity.  Messaged with Artavia at [employer] as they are [facility] this week. She will look into this and let me know.   1630 Wheelchair ordered for patient spoke to JINNY Jes, LOG sent via email to Angela and Jessie . [employer] his handling this.  LOG for first 30 days                 Expected Discharge Plan and Services                                               Social Drivers of Health (SDOH) Interventions SDOH Screenings   Food Insecurity: No Food Insecurity (07/03/2024)  Housing: Low Risk (07/03/2024)  Transportation Needs: No Transportation Needs (07/03/2024)  Utilities: Not At Risk (07/03/2024)  Depression (PHQ2-9): Low Risk (01/07/2024)  Tobacco Use: Low Risk (07/06/2024)    Readmission Risk Interventions    07/06/2024    4:07 PM  Readmission Risk Prevention Plan  Post Dischage Appt Complete  Medication Screening Complete  Transportation Screening Complete

## 2024-07-13 ENCOUNTER — Other Ambulatory Visit: Payer: Self-pay

## 2024-07-16 ENCOUNTER — Encounter: Admitting: Podiatry

## 2024-08-03 ENCOUNTER — Inpatient Hospital Stay: Payer: Self-pay | Admitting: Internal Medicine
